# Patient Record
Sex: Female | Born: 1980 | Race: White | Hispanic: No | Marital: Married | Smoking: Never smoker
Health system: Southern US, Community
[De-identification: ages and names within clinical notes are randomized; demographics above are authoritative.]

## PROBLEM LIST (undated history)

## (undated) DIAGNOSIS — G43909 Migraine, unspecified, not intractable, without status migrainosus: Secondary | ICD-10-CM

## (undated) DIAGNOSIS — R51 Headache: Secondary | ICD-10-CM

## (undated) DIAGNOSIS — D649 Anemia, unspecified: Secondary | ICD-10-CM

## (undated) DIAGNOSIS — K209 Esophagitis, unspecified without bleeding: Secondary | ICD-10-CM

## (undated) DIAGNOSIS — K635 Polyp of colon: Secondary | ICD-10-CM

## (undated) DIAGNOSIS — R5382 Chronic fatigue, unspecified: Secondary | ICD-10-CM

## (undated) DIAGNOSIS — R519 Headache, unspecified: Secondary | ICD-10-CM

## (undated) DIAGNOSIS — B2700 Gammaherpesviral mononucleosis without complication: Secondary | ICD-10-CM

## (undated) DIAGNOSIS — M1990 Unspecified osteoarthritis, unspecified site: Secondary | ICD-10-CM

## (undated) DIAGNOSIS — S060X9A Concussion with loss of consciousness of unspecified duration, initial encounter: Secondary | ICD-10-CM

## (undated) DIAGNOSIS — R202 Paresthesia of skin: Secondary | ICD-10-CM

## (undated) DIAGNOSIS — F4310 Post-traumatic stress disorder, unspecified: Secondary | ICD-10-CM

## (undated) DIAGNOSIS — S060XAA Concussion with loss of consciousness status unknown, initial encounter: Secondary | ICD-10-CM

## (undated) DIAGNOSIS — K219 Gastro-esophageal reflux disease without esophagitis: Secondary | ICD-10-CM

## (undated) DIAGNOSIS — F4312 Post-traumatic stress disorder, chronic: Secondary | ICD-10-CM

## (undated) DIAGNOSIS — E041 Nontoxic single thyroid nodule: Secondary | ICD-10-CM

## (undated) DIAGNOSIS — M5020 Other cervical disc displacement, unspecified cervical region: Secondary | ICD-10-CM

## (undated) DIAGNOSIS — R011 Cardiac murmur, unspecified: Secondary | ICD-10-CM

## (undated) HISTORY — PX: DENTAL SURGERY: SHX609

## (undated) HISTORY — DX: Chronic fatigue, unspecified: R53.82

## (undated) HISTORY — DX: Post-traumatic stress disorder, chronic: F43.12

## (undated) HISTORY — DX: Polyp of colon: K63.5

## (undated) HISTORY — DX: Esophagitis, unspecified without bleeding: K20.90

## (undated) HISTORY — DX: Cardiac murmur, unspecified: R01.1

## (undated) HISTORY — DX: Post-traumatic stress disorder, unspecified: F43.10

## (undated) HISTORY — DX: Nontoxic single thyroid nodule: E04.1

## (undated) HISTORY — DX: Esophagitis, unspecified: K20.9

## (undated) HISTORY — PX: UPPER GI ENDOSCOPY: SHX6162

## (undated) HISTORY — PX: COLONOSCOPY: SHX174

## (undated) HISTORY — DX: Concussion with loss of consciousness status unknown, initial encounter: S06.0XAA

## (undated) HISTORY — DX: Concussion with loss of consciousness of unspecified duration, initial encounter: S06.0X9A

## (undated) HISTORY — DX: Migraine, unspecified, not intractable, without status migrainosus: G43.909

## (undated) HISTORY — DX: Other cervical disc displacement, unspecified cervical region: M50.20

## (undated) HISTORY — DX: Paresthesia of skin: R20.2

---

## 2016-09-23 ENCOUNTER — Encounter: Payer: Self-pay | Admitting: Family Medicine

## 2016-09-29 ENCOUNTER — Encounter: Payer: Self-pay | Admitting: Family Medicine

## 2017-04-03 ENCOUNTER — Emergency Department (HOSPITAL_COMMUNITY): Payer: BLUE CROSS/BLUE SHIELD

## 2017-04-03 ENCOUNTER — Emergency Department (HOSPITAL_COMMUNITY)
Admission: EM | Admit: 2017-04-03 | Discharge: 2017-04-03 | Disposition: A | Discharge status: Home / Self Care | Payer: BLUE CROSS/BLUE SHIELD | Attending: Emergency Medicine | Admitting: Emergency Medicine

## 2017-04-03 ENCOUNTER — Other Ambulatory Visit: Payer: Self-pay

## 2017-04-03 ENCOUNTER — Encounter (HOSPITAL_COMMUNITY): Payer: Self-pay | Admitting: Emergency Medicine

## 2017-04-03 DIAGNOSIS — R531 Weakness: Secondary | ICD-10-CM

## 2017-04-03 DIAGNOSIS — Z79899 Other long term (current) drug therapy: Secondary | ICD-10-CM | POA: Insufficient documentation

## 2017-04-03 DIAGNOSIS — E860 Dehydration: Secondary | ICD-10-CM | POA: Insufficient documentation

## 2017-04-03 DIAGNOSIS — R634 Abnormal weight loss: Secondary | ICD-10-CM | POA: Diagnosis not present

## 2017-04-03 HISTORY — DX: Gammaherpesviral mononucleosis without complication: B27.00

## 2017-04-03 LAB — COMPREHENSIVE METABOLIC PANEL
ALT: 17 U/L (ref 14–54)
AST: 19 U/L (ref 15–41)
Albumin: 4.1 g/dL (ref 3.5–5.0)
Alkaline Phosphatase: 63 U/L (ref 38–126)
Anion gap: 10 (ref 5–15)
BUN: 11 mg/dL (ref 6–20)
CHLORIDE: 100 mmol/L — AB (ref 101–111)
CO2: 26 mmol/L (ref 22–32)
CREATININE: 0.87 mg/dL (ref 0.44–1.00)
Calcium: 9.4 mg/dL (ref 8.9–10.3)
GFR calc Af Amer: >60 mL/min (ref >60)
Glucose, Bld: 104 mg/dL — ABNORMAL HIGH (ref 65–99)
Potassium: 4.1 mmol/L (ref 3.5–5.1)
Sodium: 136 mmol/L (ref 135–145)
Total Bilirubin: 0.4 mg/dL (ref 0.3–1.2)
Total Protein: 6.7 g/dL (ref 6.5–8.1)

## 2017-04-03 LAB — URINALYSIS, ROUTINE W REFLEX MICROSCOPIC
BILIRUBIN URINE: NEGATIVE
GLUCOSE, UA: NEGATIVE mg/dL
HGB URINE DIPSTICK: NEGATIVE
Ketones, ur: NEGATIVE mg/dL
Leukocytes, UA: NEGATIVE
Nitrite: NEGATIVE
Protein, ur: NEGATIVE mg/dL
SPECIFIC GRAVITY, URINE: 1.014 (ref 1.005–1.030)
pH: 7 (ref 5.0–8.0)

## 2017-04-03 LAB — CBC WITH DIFFERENTIAL/PLATELET
BASOS ABS: 0 10*3/uL (ref 0.0–0.1)
Basophils Relative: 0 %
EOS PCT: 0 %
Eosinophils Absolute: 0 10*3/uL (ref 0.0–0.7)
HCT: 41.8 % (ref 36.0–46.0)
Hemoglobin: 13.5 g/dL (ref 12.0–15.0)
LYMPHS PCT: 21 %
Lymphs Abs: 1.8 10*3/uL (ref 0.7–4.0)
MCH: 28.9 pg (ref 26.0–34.0)
MCHC: 32.3 g/dL (ref 30.0–36.0)
MCV: 89.5 fL (ref 78.0–100.0)
Monocytes Absolute: 0.4 10*3/uL (ref 0.1–1.0)
Monocytes Relative: 4 %
NEUTROS ABS: 6.5 10*3/uL (ref 1.7–7.7)
Neutrophils Relative %: 75 %
PLATELETS: 274 10*3/uL (ref 150–400)
RBC: 4.67 MIL/uL (ref 3.87–5.11)
RDW: 12.7 % (ref 11.5–15.5)
WBC: 8.6 10*3/uL (ref 4.0–10.5)

## 2017-04-03 LAB — I-STAT BETA HCG BLOOD, ED (MC, WL, AP ONLY): I-stat hCG, quantitative: <5 m[IU]/mL (ref <5)

## 2017-04-03 MED ORDER — SODIUM CHLORIDE 0.9 % IV BOLUS (SEPSIS)
1000.0000 mL | Freq: Once | INTRAVENOUS | Status: AC
  Administered 2017-04-03: 1000 mL via INTRAVENOUS

## 2017-04-03 NOTE — ED Provider Notes (Signed)
[facility] Provider Note   CSN: [id number] Arrival date & time: 04/03/17  1152     History   Chief Complaint Chief Complaint  Patient presents with  . Weight Loss    HPI [facility] is a 37 y.o. female.  Patient states that she has been losing a lot of weight.  Over the last couple years she has had difficulty with absorbing nutrition.  She was in a foreign country [place] a long time and has been cared for by a specialist in immune diseases in [place].  She feels like she has been losing more weight again.  No vomiting or diarrhea   The history is provided by the patient. No language interpreter was used.  Weakness  Primary symptoms include focal weakness. This is a recurrent problem. The current episode started more than 1 week ago. The problem has not changed since onset.There was no focality noted. There has been no fever. Pertinent negatives include no shortness of breath, no chest pain and no headaches. There were no medications administered prior to arrival. Associated medical issues include trauma.    Past Medical History:  Diagnosis Date  . Acute Epstein Barr virus (EBV) infection     There are no active problems to display for this patient.   Past Surgical History:  Procedure Laterality Date  . DENTAL SURGERY    . UPPER GI ENDOSCOPY      OB History    Gravida Para Term Preterm AB Living             4   SAB TAB Ectopic Multiple Live Births                   Home Medications    Prior to Admission medications   Medication Sig Start Date End Date Taking? Authorizing Provider  ACYCLOVIR PO Take 500 mg by mouth 5 (five) times daily.   Yes [provider]  Cholecalciferol (VITAMIN D3) LIQD Place 1 drop under the tongue daily.   Yes [provider]  Omega-3 Fatty Acids (FISH OIL PO) Take 1 capsule by mouth 3 (three) times daily.   Yes [provider]  OVER THE COUNTER MEDICATION Place 5 mLs under the tongue  daily. Glutothiame   Yes [provider]  OVER THE COUNTER MEDICATION Take 1 tablet by mouth 3 (three) times daily. Optiferin-C   Yes [provider]  OVER THE COUNTER MEDICATION Take 2 tablets by mouth 3 (three) times daily. Ultra Preventive   Yes [provider]    Family History History reviewed. No pertinent family history.  Social History Social History   Tobacco Use  . Smoking status: Never Smoker  . Smokeless tobacco: Never Used  Substance Use Topics  . Alcohol use: No    Frequency: Never  . Drug use: No     Allergies   Patient has no known allergies.   Review of Systems Review of Systems  Constitutional: Negative for appetite change and fatigue.  HENT: Negative for congestion, ear discharge and sinus pressure.   Eyes: Negative for discharge.  Respiratory: Negative for cough and shortness of breath.   Cardiovascular: Negative for chest pain.  Gastrointestinal: Negative for abdominal pain and diarrhea.  Genitourinary: Negative for frequency and hematuria.  Musculoskeletal: Negative for back pain.  Skin: Negative for rash.  Neurological: Positive for focal weakness and weakness. Negative for seizures and headaches.  Psychiatric/Behavioral: Negative for hallucinations.     Physical Exam Updated Vital  Signs BP 116/82   Pulse (!) 119   Temp 98.4 F (36.9 C) (Oral)   Resp 14   Ht 5\' 4"  (1.626 m)   Wt 50.2 kg (110 lb 11.2 oz)   LMP 03/20/2017   SpO2 98%   BMI 19.00 kg/m   Physical Exam  Constitutional: She is oriented to person, place, and time.  Patient is cachectic  HENT:  Head: Normocephalic.  Eyes: Conjunctivae and EOM are normal. No scleral icterus.  Neck: Neck supple. No thyromegaly present.  Cardiovascular: Normal rate and regular rhythm. Exam reveals no gallop and no friction rub.  No murmur heard. Pulmonary/Chest: No stridor. She has no wheezes. She has no rales. She exhibits no tenderness.  Abdominal: She exhibits  no distension. There is no tenderness. There is no rebound.  Musculoskeletal: Normal range of motion. She exhibits no edema.  Lymphadenopathy:    She has no cervical adenopathy.  Neurological: She is oriented to person, place, and time. She exhibits normal muscle tone. Coordination normal.  Skin: No rash noted. No erythema.  Psychiatric: She has a normal mood and affect. Her behavior is normal.     ED Treatments / Results  Labs (all labs ordered are listed, but only abnormal results are displayed) Labs Reviewed  COMPREHENSIVE METABOLIC PANEL - Abnormal; Notable for the following components:      Result Value   Chloride 100 (*)    Glucose, Bld 104 (*)    All other components within normal limits  URINALYSIS, ROUTINE W REFLEX MICROSCOPIC - Abnormal; Notable for the following components:   APPearance HAZY (*)    All other components within normal limits  CBC WITH DIFFERENTIAL/PLATELET  I-STAT BETA HCG BLOOD, ED ([facility], [facility], [facility] ONLY)    EKG  EKG Interpretation None       Radiology Dg Abd Acute W/chest  Result Date: 04/03/2017 CLINICAL DATA:  Weight loss EXAM: DG ABDOMEN ACUTE W/ 1V CHEST COMPARISON:  None. FINDINGS: There is no evidence of dilated bowel loops or free intraperitoneal air. No radiopaque calculi or other significant radiographic abnormality is seen. Heart size and mediastinal contours are within normal limits. Both lungs are clear. IMPRESSION: Negative abdominal radiographs.  No acute cardiopulmonary disease. Electronically Signed   By: Marlan Palau M.D.   On: 04/03/2017 17:22    Procedures Procedures (including critical care time)  Medications Ordered in ED Medications  sodium chloride 0.9 % bolus 1,000 mL (1,000 mLs Intravenous New Bag/Given 04/03/17 1547)     Initial Impression / Assessment and Plan / ED Course  I have reviewed the triage vital signs and the nursing notes.  Pertinent labs & imaging results that were available during my care of the patient  were reviewed by me and considered in my medical decision making (see chart for details). Labs including CBC chemistries liver panel were all unremarkable plain films of the abdomen unremarkable.  Urinalysis normal.  Patient was given 1 L of fluids because of mild dehydration.  She was told to eat high calorie foods since she has lost 10 pounds and drink Ensure between meals.  She is been referred to GI here      Final Clinical Impressions(s) / ED Diagnoses   Final diagnoses:  Weakness    ED Discharge Orders    None       Bethann Berkshire, MD 04/03/17 [phone]

## 2017-04-03 NOTE — Discharge Instructions (Signed)
Eat high calorie meals.  Drink Ensure between meals.  And follow-up with a gastroenterologist.  You have been referred to Dr.Rourke in [place]

## 2017-04-03 NOTE — ED Triage Notes (Signed)
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

## 2017-04-03 NOTE — ED Notes (Signed)
Pt would [facility] a [facility].  Phone number [phone]

## 2017-04-13 ENCOUNTER — Ambulatory Visit (INDEPENDENT_AMBULATORY_CARE_PROVIDER_SITE_OTHER): Payer: BLUE CROSS/BLUE SHIELD | Admitting: Family Medicine

## 2017-04-13 ENCOUNTER — Other Ambulatory Visit: Payer: Self-pay

## 2017-04-13 ENCOUNTER — Encounter: Payer: Self-pay | Admitting: Family Medicine

## 2017-04-13 VITALS — BP 114/80 | HR 88 | Temp 98.1°F | Resp 16 | Ht 64.0 in | Wt 112.0 lb

## 2017-04-13 DIAGNOSIS — E063 Autoimmune thyroiditis: Secondary | ICD-10-CM | POA: Diagnosis not present

## 2017-04-13 DIAGNOSIS — E559 Vitamin D deficiency, unspecified: Secondary | ICD-10-CM | POA: Diagnosis not present

## 2017-04-13 DIAGNOSIS — Z87828 Personal history of other (healed) physical injury and trauma: Secondary | ICD-10-CM | POA: Diagnosis not present

## 2017-04-13 DIAGNOSIS — H532 Diplopia: Secondary | ICD-10-CM

## 2017-04-13 DIAGNOSIS — R634 Abnormal weight loss: Secondary | ICD-10-CM | POA: Diagnosis not present

## 2017-04-13 DIAGNOSIS — R760 Raised antibody titer: Secondary | ICD-10-CM | POA: Diagnosis not present

## 2017-04-13 DIAGNOSIS — R5383 Other fatigue: Secondary | ICD-10-CM | POA: Diagnosis not present

## 2017-04-13 DIAGNOSIS — K209 Esophagitis, unspecified without bleeding: Secondary | ICD-10-CM

## 2017-04-13 DIAGNOSIS — M62838 Other muscle spasm: Secondary | ICD-10-CM

## 2017-04-13 DIAGNOSIS — Z91018 Allergy to other foods: Secondary | ICD-10-CM

## 2017-04-13 DIAGNOSIS — H539 Unspecified visual disturbance: Secondary | ICD-10-CM

## 2017-04-13 MED ORDER — VITAMIN D (ERGOCALCIFEROL) 1.25 MG (50000 UNIT) PO CAPS: 50000.0000 [IU] | ORAL_CAPSULE | ORAL | 0 refills | Status: DC
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
## 2017-04-14 DIAGNOSIS — M5022 Other cervical disc displacement, mid-cervical region, unspecified level: Secondary | ICD-10-CM | POA: Diagnosis not present

## 2017-04-14 DIAGNOSIS — M47816 Spondylosis without myelopathy or radiculopathy, lumbar region: Secondary | ICD-10-CM | POA: Diagnosis not present

## 2017-04-14 DIAGNOSIS — M47812 Spondylosis without myelopathy or radiculopathy, cervical region: Secondary | ICD-10-CM | POA: Diagnosis not present

## 2017-04-14 DIAGNOSIS — M546 Pain in thoracic spine: Secondary | ICD-10-CM | POA: Diagnosis not present

## 2017-04-14 LAB — HEPATITIS PANEL, ACUTE
Hep A IgM: NONREACTIVE
Hep B C IgM: NONREACTIVE
Hepatitis B Surface Ag: NONREACTIVE
Hepatitis C Ab: NONREACTIVE
SIGNAL TO CUT-OFF: 0.01 (ref <1.00)

## 2017-04-14 LAB — MAGNESIUM: MAGNESIUM: 2.1 mg/dL (ref 1.5–2.5)

## 2017-04-14 LAB — CK: Total CK: 89 U/L (ref 29–143)

## 2017-04-14 LAB — SEDIMENTATION RATE: SED RATE: 2 mm/h (ref 0–20)

## 2017-04-14 LAB — VITAMIN B12: VITAMIN B 12: 951 pg/mL (ref 200–1100)

## 2017-04-14 LAB — TSH: TSH: 0.95 m[IU]/L

## 2017-04-15 DIAGNOSIS — R5383 Other fatigue: Secondary | ICD-10-CM | POA: Insufficient documentation

## 2017-04-15 DIAGNOSIS — M62838 Other muscle spasm: Secondary | ICD-10-CM | POA: Insufficient documentation

## 2017-04-15 DIAGNOSIS — H539 Unspecified visual disturbance: Secondary | ICD-10-CM | POA: Insufficient documentation

## 2017-04-15 DIAGNOSIS — R634 Abnormal weight loss: Secondary | ICD-10-CM | POA: Insufficient documentation

## 2017-04-15 DIAGNOSIS — K209 Esophagitis, unspecified without bleeding: Secondary | ICD-10-CM | POA: Insufficient documentation

## 2017-04-15 DIAGNOSIS — Z87828 Personal history of other (healed) physical injury and trauma: Secondary | ICD-10-CM | POA: Insufficient documentation

## 2017-04-15 DIAGNOSIS — R760 Raised antibody titer: Secondary | ICD-10-CM | POA: Insufficient documentation

## 2017-04-15 DIAGNOSIS — Z91018 Allergy to other foods: Secondary | ICD-10-CM | POA: Insufficient documentation

## 2017-04-15 DIAGNOSIS — E063 Autoimmune thyroiditis: Secondary | ICD-10-CM | POA: Insufficient documentation

## 2017-04-15 DIAGNOSIS — E559 Vitamin D deficiency, unspecified: Secondary | ICD-10-CM | POA: Insufficient documentation

## 2017-04-18 ENCOUNTER — Telehealth: Payer: Self-pay | Admitting: Family Medicine

## 2017-04-18 NOTE — Telephone Encounter (Signed)
Please call and advise patient that her labs were all within normal limits.  I have reviewed all of the records that she brought into the day of the visit.  After review of the testing due to her elevated ANA levels I am in agreement that she should be seen and evaluated by rheumatology.  I have placed that referral.  In addition, due to the recent endoscopy revealing esophagitis and her food allergies and intolerance I would like for her to be seen by Dr. Dionicia Ableraman.  I have placed that referral.  We do need to figure out if any biopsies were done at the time of the endoscopy and get the results of those.  Please ask her to get those results.  Due to her neurologic symptoms we will go ahead and proceed with the MRI of her head and after that test consider neurology.  I would like for her to see the allergist regarding all of her food allergies and intolerances.  I believe that she needs some actual test results in English that we can review and know are valid prior to placing her under such a restrictive diet with her degree of weight loss.  Please advise her that her hepatitis ABC was negative.  Her magnesium, B12, CK, and electrolytes were within normal limits. Please ask her to keep her scheduled follow-up and we will discuss more detail at that time.

## 2017-04-20 DIAGNOSIS — M47816 Spondylosis without myelopathy or radiculopathy, lumbar region: Secondary | ICD-10-CM | POA: Diagnosis not present

## 2017-04-20 DIAGNOSIS — M5022 Other cervical disc displacement, mid-cervical region, unspecified level: Secondary | ICD-10-CM | POA: Diagnosis not present

## 2017-04-20 DIAGNOSIS — M47812 Spondylosis without myelopathy or radiculopathy, cervical region: Secondary | ICD-10-CM | POA: Diagnosis not present

## 2017-04-20 DIAGNOSIS — M546 Pain in thoracic spine: Secondary | ICD-10-CM | POA: Diagnosis not present

## 2017-04-21 ENCOUNTER — Ambulatory Visit: Payer: BLUE CROSS/BLUE SHIELD | Admitting: Family Medicine

## 2017-04-21 NOTE — Telephone Encounter (Signed)
Called patient regarding message below. No answer, left generic message for patient to return call.   

## 2017-04-24 ENCOUNTER — Ambulatory Visit (HOSPITAL_COMMUNITY): Payer: BLUE CROSS/BLUE SHIELD

## 2017-04-24 ENCOUNTER — Other Ambulatory Visit (HOSPITAL_COMMUNITY): Payer: BLUE CROSS/BLUE SHIELD

## 2017-04-24 ENCOUNTER — Ambulatory Visit (HOSPITAL_COMMUNITY)
Admission: RE | Admit: 2017-04-24 | Discharge: 2017-04-24 | Disposition: A | Discharge status: Home / Self Care | Payer: BLUE CROSS/BLUE SHIELD | Source: Ambulatory Visit | Attending: Family Medicine | Admitting: Family Medicine

## 2017-04-24 DIAGNOSIS — H532 Diplopia: Secondary | ICD-10-CM | POA: Insufficient documentation

## 2017-04-24 DIAGNOSIS — R200 Anesthesia of skin: Secondary | ICD-10-CM | POA: Diagnosis not present

## 2017-04-24 MED ORDER — GADOBENATE DIMEGLUMINE 529 MG/ML IV SOLN
10.0000 mL | Freq: Once | INTRAVENOUS | Status: AC | PRN
  Administered 2017-04-24: 10 mL via INTRAVENOUS

## 2017-04-24 NOTE — Telephone Encounter (Signed)
Told to call [employer] services regarding peer to peer to get the MRI approved. Called and discussed with [employer]. Number valid from 04/21/2017 through 05/20/2017.Number [id number]. This is the pre-authorization number for the MRI of the brain. Facility will need this number to authorize.

## 2017-04-24 NOTE — Telephone Encounter (Signed)
Called rad dept and they are going to let [facility] come in now.  I just called [facility] and she is on the way to [facility] not to get MRI.  -  Called back and gave [facility] Rad Auth #.

## 2017-04-25 ENCOUNTER — Ambulatory Visit (HOSPITAL_COMMUNITY): Payer: BLUE CROSS/BLUE SHIELD

## 2017-04-25 ENCOUNTER — Ambulatory Visit: Payer: BLUE CROSS/BLUE SHIELD | Admitting: Diagnostic Neuroimaging

## 2017-04-25 ENCOUNTER — Encounter: Payer: Self-pay | Admitting: Diagnostic Neuroimaging

## 2017-04-25 VITALS — BP 122/85 | HR 88 | Ht 64.0 in | Wt 113.4 lb

## 2017-04-25 DIAGNOSIS — R413 Other amnesia: Secondary | ICD-10-CM

## 2017-04-25 DIAGNOSIS — M62838 Other muscle spasm: Secondary | ICD-10-CM | POA: Diagnosis not present

## 2017-04-25 NOTE — Progress Notes (Signed)
[facility]  PATIENT: Tina Haney DOB: December 01, 1980  REFERRING CLINICIAN: Milas Gain, MD HISTORY FROM: patient and chart review  REASON FOR VISIT: new consult    HISTORICAL  CHIEF COMPLAINT:  Chief Complaint  Patient presents with  . NP  Hagler    Has lived in [place] for 9 yrs, 45yrs started with GI, mental issues.  These issues still ongoing.  Now living here since January 2019.  Past yr.  muscle twitching back of arms, legs eye twtiching, muscle cramps. +ANA, +EBV  Has some autoimmune disease?  MS?   Brain concussion 1 1/2 year ago , +amnesia, recovered has some brain fog.   Marland Kitchen Hx head Injury,Muscle twitching, cramps.    HISTORY OF PRESENT ILLNESS:   37 year old female here for evaluation of muscle twitching, muscle spasm, fatigue, weakness, headaches, brain fog, eye twitching, blurred vision, since 2012.  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  By 2012 patient had lost weight, had GI symptoms with abdominal pain, and was evaluated by specialist.  She was diagnosed with vitamin deficiencies and iron deficiencies.  These were treated.  Continue to progressively worsen.  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  She was recommended to treat these conservatively.  Patient has had a vegan diet since age 65 up to 37 years old.  She has taken B12 supplements in the past but was found to be low in 2012.  Recent B12 levels have been normal.  For past 2 years patient has been eating some meat products in her diet.  Patient had MRI of the brain yesterday which we reviewed together.  Patient has had positive ANA in the past and seen rheumatologist in [place].  She is also planning to see a rheumatologist in [place] soon.  Patient has had significant depression, anxiety,  PTSD, chronic stress syndrome diagnoses in the past.  She feels like these are improved compared to previously.    REVIEW OF SYSTEMS: Full 14 system review of systems performed and negative with exception of: Weight loss fatigue ringing in ears itching blurred vision eye pain easy bruising lymph node enlargement memory loss headache numbness weakness dizziness passing out tremor sleepiness restless leg depression anxiety too much sleep decreased energy cramps aching muscle joint pain constipation.   ALLERGIES: No Known Allergies  HOME MEDICATIONS: Outpatient Medications Prior to Visit  Medication Sig Dispense Refill  . acyclovir (ZOVIRAX) 400 MG tablet 400 mg 3 (three) times daily.   3  . Cholecalciferol (VITAMIN D3) LIQD Place 1 drop under the tongue daily.    Marland Kitchen LYSINE PO Take 3 each by mouth.    . Omega-3 Fatty Acids (FISH OIL PO) Take 1 capsule by mouth 3 (three) times daily.    Marland Kitchen OVER THE COUNTER MEDICATION Place 5 mLs under the tongue daily. Glutothiame    . OVER THE COUNTER MEDICATION Take 2 tablets by mouth 3 (three) times daily. Ultra Preventive    . OVER THE COUNTER MEDICATION     . OVER THE COUNTER MEDICATION     . OVER THE COUNTER MEDICATION     . OVER THE COUNTER MEDICATION Take 1 tablet by mouth 3 (three) times daily. Optiferin-C     No facility-administered medications prior to visit.     PAST MEDICAL HISTORY: Past Medical History:  Diagnosis Date  .  Acute Epstein Barr virus (EBV) infection   . Chronic posttraumatic stress syndrome   . Concussion    09/2015,  Was 2nd or 3rd concussion.   . Esophagitis   . Herniated disc, cervical    C5/6 and 6/7   . Murmur, heart   . Polyp of colon   . PTSD (post-traumatic stress disorder)   . Thyroid nodule     PAST SURGICAL HISTORY: Past Surgical History:  Procedure Laterality Date  . COLONOSCOPY     Done in [place], 2 polyps, benign  . DENTAL SURGERY    . UPPER GI ENDOSCOPY     recent EGD done in [place] by Dr.  Uvaldo Rising    FAMILY HISTORY: Family History  Problem Relation Age of Onset  . Bipolar disorder Mother   . Celiac disease Daughter   . Cancer Maternal Grandmother   . Alzheimer's disease Maternal Grandmother   . Kidney disease Maternal Grandfather     SOCIAL HISTORY:  Social History   Socioeconomic History  . Marital status: Married    Spouse name: Not on file  . Number of children: Not on file  . Years of education: Not on file  . Highest education level: Not on file  Occupational History  . Not on file  Social Needs  . Financial resource strain: Not on file  . Food insecurity:    Worry: Not on file    Inability: Not on file  . Transportation needs:    Medical: Not on file    Non-medical: Not on file  Tobacco Use  . Smoking status: Never Smoker  . Smokeless tobacco: Never Used  Substance and Sexual Activity  . Alcohol use: No    Frequency: Never  . Drug use: No  . Sexual activity: Yes    Partners: Male    Birth control/protection: Surgical    Comment: husband vastectomy  Lifestyle  . Physical activity:    Days per week: Not on file    Minutes per session: Not on file  . Stress: Not on file  Relationships  . Social connections:    Talks on phone: Not on file    Gets together: Not on file    Attends religious service: Not on file    Active member of club or organization: Not on file    Attends meetings of clubs or organizations: Not on file    Relationship status: Not on file  . Intimate partner violence:    Fear of current or ex partner: Not on file    Emotionally abused: Not on file    Physically abused: Not on file    Forced sexual activity: Not on file  Other Topics Concern  . Not on file  Social History Narrative   Born in [place]. Grew up in [place] and [place]. Married with four children. Married for 15 years-12, 11, 10, 7. Attend school at [employer].   Education: BS some masters work.  Work: just had to resign.  Caffiene: one cup of  tea / day.    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   [employer] in [place], [place].    Came back to the [place] in 07/2016.    Seen by Functional medicine doctor and had lots of labs done and put on lots of supplement.      PHYSICAL EXAM  GENERAL EXAM/CONSTITUTIONAL: Vitals:  Vitals:   04/25/17 0852  BP: 122/85  Pulse: 88  Weight: 113  lb 6.4 oz (51.4 kg)  Height: 5\' 4"  (1.626 m)   Wt Readings from Last 3 Encounters:  04/25/17 113 lb 6.4 oz (51.4 kg)  04/13/17 112 lb (50.8 kg)  04/03/17 110 lb 11.2 oz (50.2 kg)       Body mass index is 19.47 kg/m.  Visual Acuity Screening   Right eye Left eye Both eyes  Without correction:     With correction: 20/30 20/20      Patient is in no distress; well developed, nourished and groomed; neck is supple  CARDIOVASCULAR:  Examination of carotid arteries is normal; no carotid bruits  Regular rate and rhythm, no murmurs  Examination of peripheral vascular system by observation and palpation is normal  EYES:  Ophthalmoscopic exam of optic discs and posterior segments is normal; no papilledema or hemorrhages  MUSCULOSKELETAL:  Gait, strength, tone, movements noted in Neurologic exam below  NEUROLOGIC: MENTAL STATUS:  No flowsheet data found.  awake, alert, oriented to person, place and time  recent and remote memory intact  normal attention and concentration  language fluent, comprehension intact, naming intact,   fund of knowledge appropriate  CRANIAL NERVE:   2nd - no papilledema on fundoscopic exam  2nd, 3rd, 4th, 6th - pupils equal and reactive to light, visual fields full to confrontation, extraocular muscles intact, no nystagmus  5th - facial sensation symmetric  7th - facial strength symmetric  8th - hearing intact  9th - palate elevates symmetrically, uvula midline  11th - shoulder shrug symmetric  12th - tongue protrusion midline  MOTOR:   normal bulk and tone,  full strength in the BUE, BLE  SENSORY:  normal and symmetric to light touch, temperature, vibration; PATCHY DECR PP IN FINGERS AND FEET COORDINATION:   finger-nose-finger, fine finger movements normal  REFLEXES:   deep tendon reflexes present and symmetric  GAIT/STATION:   narrow based gait; able to walk on toes, heels and tandem; romberg is negative    DIAGNOSTIC DATA (LABS, IMAGING, TESTING) - I reviewed patient records, labs, notes, testing and imaging myself where available.  Lab Results  Component Value Date   WBC 8.6 04/03/2017   HGB 13.5 04/03/2017   HCT 41.8 04/03/2017   MCV 89.5 04/03/2017   PLT 274 04/03/2017      Component Value Date/Time   NA 136 04/03/2017 1312   K 4.1 04/03/2017 1312   CL 100 (L) 04/03/2017 1312   CO2 26 04/03/2017 1312   GLUCOSE 104 (H) 04/03/2017 1312   BUN 11 04/03/2017 1312   CREATININE 0.87 04/03/2017 1312   CALCIUM 9.4 04/03/2017 1312   PROT 6.7 04/03/2017 1312   ALBUMIN 4.1 04/03/2017 1312   AST 19 04/03/2017 1312   ALT 17 04/03/2017 1312   ALKPHOS 63 04/03/2017 1312   BILITOT 0.4 04/03/2017 1312   GFRNONAA >60 04/03/2017 1312   GFRAA >60 04/03/2017 1312   No results found for: CHOL, HDL, LDLCALC, LDLDIRECT, TRIG, CHOLHDL No results found for: JYNW2N Lab Results  Component Value Date   VITAMINB12 951 04/13/2017   Lab Results  Component Value Date   TSH 0.95 04/13/2017   Lab Results  Component Value Date   CKTOTAL 89 04/13/2017    04/27/16 EMG/NCS (report from [place]) - 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 -VRP]  - Normal examination.  No abnormality seen to explain headache or the other presenting symptoms.    ASSESSMENT AND PLAN  37 y.o. year old female here with constellation of symptoms including numbness,  weakness, muscle spasm, muscle twitching, fatigue, mental processing difficulty, brain fog, history of concussions, history of positive ANA, history of PTSD.  Neurologic examination, MRI brain are unremarkable.  Patient may have some evidence for a left C6-7 radiculopathy although no evidence clinically today.  No specific neurologic diagnosis found.   Dx:  1. Muscle spasm   2. Memory loss      PLAN:  - I reassured patient that I find no evidence of primary neurologic disease - continue to optimized nutrition, physical activity and stress mgmt techniques  Return pending symptom progression or change. may return as needed    Suanne MarkerVIKRAM R. PENUMALLI, MD 04/25/2017, 9:29 AM Certified in Neurology, Neurophysiology and Neuroimaging  [facility] [address], [address] [place], [place] [address] [phone]

## 2017-04-26 DIAGNOSIS — M5030 Other cervical disc degeneration, unspecified cervical region: Secondary | ICD-10-CM | POA: Diagnosis not present

## 2017-04-26 DIAGNOSIS — G894 Chronic pain syndrome: Secondary | ICD-10-CM | POA: Diagnosis not present

## 2017-04-27 ENCOUNTER — Ambulatory Visit (INDEPENDENT_AMBULATORY_CARE_PROVIDER_SITE_OTHER): Payer: BLUE CROSS/BLUE SHIELD | Admitting: Family Medicine

## 2017-04-27 ENCOUNTER — Encounter: Payer: Self-pay | Admitting: Family Medicine

## 2017-04-27 VITALS — BP 114/78 | HR 77 | Temp 98.0°F | Resp 16 | Ht 64.0 in | Wt 113.5 lb

## 2017-04-27 DIAGNOSIS — R768 Other specified abnormal immunological findings in serum: Secondary | ICD-10-CM

## 2017-04-27 DIAGNOSIS — F419 Anxiety disorder, unspecified: Secondary | ICD-10-CM | POA: Diagnosis not present

## 2017-04-27 DIAGNOSIS — R5383 Other fatigue: Secondary | ICD-10-CM | POA: Diagnosis not present

## 2017-04-27 NOTE — Progress Notes (Signed)
Patient ID: Tina Haney, female    DOB: March 05, 1980, 37 y.o.   MRN: [id number]  Chief Complaint  Patient presents with  . Follow-up    Allergies Patient has no known allergies.  Subjective:   Tina Haney is a 37 y.o. female who presents to [facility] today.  HPI Tina Haney presents today for a brief follow-up visit after being seen by the neurologist  and having had the MRI completed.  She reports that she was told that her MRI was within normal limits and there was no evidence of any neurologic disease that was affecting her brain.  She reports that she is not sure that she necessarily agrees with this because she reports she is still having symptoms associated with feeling tired and she is concerned that the rheumatologic disease that she could have could be affecting her neurologically.  She was also seen by Dr. Ethelene Hal in orthopedics.  She reports that he is possibly going to do injections after getting the records from previous MD. she also reports that she is still not sure but she may end up needing surgery on her spine secondary to her disc disease.  She reports that she has not seen or heard anything about the rheumatologist visit.  She reports that she is concerned about all the symptoms that she is having.  She reports that she is concerned she could have Sjogren's syndrome.  She believes that she may need a lip biopsy performed to help diagnose this.  She reports that she has an upcoming allergy appointment but she is not sure if she should go.  She reports that her allergy testing in the past did show some food allergies that she is cut out the foods in her diet more because she believes that she needs to for an anti-inflammatory diet to help with her rheumatologic issues.  She reports that she avoids gluten, dairy, and certain other foods because of bloating.  She does have an upcoming appointment with gastroenterology.  She reports that she is not interested in  seeing a psychiatrist or therapist at this time.  She does believe that she has depression but does not feel that she clinically needs to be on medication at this time.  She reports that her energy level is still low, she does not always sleep well.  She can feel anxious.  She reports that she does not feel clinically depressed.  She does have a past history of PTSD, anxiety, and history of depression.  She feels like she should not go on medications to control her symptoms because she believes that then everyone tries to believe her symptoms are not related to a rheumatologic disorder but due to an anxiety disorder.   Past Medical History:  Diagnosis Date  . Acute Epstein Barr virus (EBV) infection   . Chronic posttraumatic stress syndrome   . Concussion    09/2015,  Was 2nd or 3rd concussion.   . Esophagitis   . Herniated disc, cervical    C5/6 and 6/7   . Murmur, heart   . Polyp of colon   . PTSD (post-traumatic stress disorder)   . Thyroid nodule     Past Surgical History:  Procedure Laterality Date  . COLONOSCOPY     Done in [place], 2 polyps, benign  . DENTAL SURGERY    . UPPER GI ENDOSCOPY     recent EGD done in [place] by Dr. Uvaldo Rising    Family History  Problem Relation Age  of Onset  . Bipolar disorder Mother   . Celiac disease Daughter   . Cancer Maternal Grandmother   . Alzheimer's disease Maternal Grandmother   . Kidney disease Maternal Grandfather      Social History   Socioeconomic History  . Marital status: Married    Spouse name: Not on file  . Number of children: Not on file  . Years of education: Not on file  . Highest education level: Not on file  Occupational History  . Not on file  Social Needs  . Financial resource strain: Not on file  . Food insecurity:    Worry: Not on file    Inability: Not on file  . Transportation needs:    Medical: Not on file    Non-medical: Not on file  Tobacco Use  . Smoking status: Never Smoker  . Smokeless tobacco:  Never Used  Substance and Sexual Activity  . Alcohol use: No    Frequency: Never  . Drug use: No  . Sexual activity: Yes    Partners: Male    Birth control/protection: Surgical    Comment: husband vastectomy  Lifestyle  . Physical activity:    Days per week: Not on file    Minutes per session: Not on file  . Stress: Not on file  Relationships  . Social connections:    Talks on phone: Not on file    Gets together: Not on file    Attends religious service: Not on file    Active member of club or organization: Not on file    Attends meetings of clubs or organizations: Not on file    Relationship status: Not on file  Other Topics Concern  . Not on file  Social History Narrative   Born in [place] Married with four children. Married for 15 years-12, 11, 10, 7. Attend school at [employer].   Education: BS some masters work.  Work: just had to resign.  Caffiene: one cup of tea / day.    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   [employer], [place].    [place] in [place].    Seen by Functional medicine doctor and had lots of labs done and put on lots of supplement.     Review of Systems  Constitutional: Positive for fatigue.  Eyes:       Patient reports dry eyes and inability to wear contacts due to dry eyes.  Gastrointestinal: Positive for abdominal distention. Negative for abdominal pain and nausea.  Musculoskeletal: Positive for arthralgias and neck pain. Negative for joint swelling.  Skin: Negative for rash.  Psychiatric/Behavioral: Negative for agitation, behavioral problems, dysphoric mood and suicidal ideas. The patient is nervous/anxious.      Objective:   BP 114/78 (BP Location: Left Arm, Patient Position: Sitting, Cuff Size: Normal)   Pulse 77   Temp 98 F (36.7 C) (Temporal)   Resp 16   Ht 5\' 4"  (1.626 m)   Wt 113 lb 8 oz (51.5 kg)   LMP 04/18/2017   SpO2 97%    BMI 19.48 kg/m   Physical Exam Alert and oriented in no acute distress.  Heart regular rate and rhythm.  Lungs clear to auscultation bilaterally.  Skin without obvious rashes.  Lips appear dry.  Patient is wearing glasses today in office.  Anxious mood and affect.  Slightly labile.  Tearful during discussion in office  today.  Assessment and Plan  1. Fatigue, unspecified type 2. Positive ANA (antinuclear antibody) 3. Anxiety  Discussed with patient regarding her upcoming appointments.  She is somewhat hesitant to go to the allergist.  I have reviewed her allergy studies and testing that was performed.  I do recommend with her symptoms that she keep scheduled follow-up with allergy and gastroenterology.  I did tell her that we need to get the records from the physician to perform the endoscopy in [place], including the biopsy results.  She is to keep scheduled appointment with orthopedics.  Today we did call and get patient an appointment with rheumatologist.  She defers psychiatry at this time.  She will consider therapy.  I did discuss with patient in detail that I do believe that she has true symptoms of fatigue and joint pain/inflammation with a possible rheumatologic condition.  However, I also believe that she has suffered from tremendous chronic stress secondary to her life [place] and still is dealing with trauma and subsequent anxiety and possible depression.  She is not interested in taking any medication at this time.  I did tell her if she changes her mind at any point to please let me know.  She will consider seeing a therapist.  She will follow-up in our office in 3 months or after she completes these follow-up visits.  If she does develop any questions or concerns throughout this process she will let us know. Return in about 3 months (around 07/28/2017). Aliene Beams, MD 04/27/2017

## 2017-04-28 DIAGNOSIS — M5022 Other cervical disc displacement, mid-cervical region, unspecified level: Secondary | ICD-10-CM | POA: Diagnosis not present

## 2017-04-28 DIAGNOSIS — M47816 Spondylosis without myelopathy or radiculopathy, lumbar region: Secondary | ICD-10-CM | POA: Diagnosis not present

## 2017-04-28 DIAGNOSIS — M546 Pain in thoracic spine: Secondary | ICD-10-CM | POA: Diagnosis not present

## 2017-04-28 DIAGNOSIS — M47812 Spondylosis without myelopathy or radiculopathy, cervical region: Secondary | ICD-10-CM | POA: Diagnosis not present
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
## 2017-05-01 ENCOUNTER — Ambulatory Visit: Payer: BLUE CROSS/BLUE SHIELD | Admitting: Rheumatology

## 2017-05-01 ENCOUNTER — Ambulatory Visit (INDEPENDENT_AMBULATORY_CARE_PROVIDER_SITE_OTHER): Payer: Self-pay

## 2017-05-01 ENCOUNTER — Encounter: Payer: Self-pay | Admitting: Rheumatology

## 2017-05-01 ENCOUNTER — Telehealth: Payer: Self-pay | Admitting: Rheumatology

## 2017-05-01 VITALS — BP 125/81 | HR 90 | Resp 15 | Ht 64.0 in | Wt 117.0 lb

## 2017-05-01 DIAGNOSIS — R768 Other specified abnormal immunological findings in serum: Secondary | ICD-10-CM | POA: Diagnosis not present

## 2017-05-01 DIAGNOSIS — M79672 Pain in left foot: Secondary | ICD-10-CM | POA: Diagnosis not present

## 2017-05-01 DIAGNOSIS — M79671 Pain in right foot: Secondary | ICD-10-CM

## 2017-05-01 DIAGNOSIS — M5136 Other intervertebral disc degeneration, lumbar region: Secondary | ICD-10-CM | POA: Diagnosis not present

## 2017-05-01 DIAGNOSIS — M5030 Other cervical disc degeneration, unspecified cervical region: Secondary | ICD-10-CM | POA: Diagnosis not present

## 2017-05-01 DIAGNOSIS — R5383 Other fatigue: Secondary | ICD-10-CM

## 2017-05-01 DIAGNOSIS — E063 Autoimmune thyroiditis: Secondary | ICD-10-CM

## 2017-05-01 DIAGNOSIS — E559 Vitamin D deficiency, unspecified: Secondary | ICD-10-CM

## 2017-05-01 DIAGNOSIS — R634 Abnormal weight loss: Secondary | ICD-10-CM | POA: Diagnosis not present

## 2017-05-01 NOTE — Telephone Encounter (Signed)
Attempted to contact the patient and left message for patient to call the office.  

## 2017-05-01 NOTE — Telephone Encounter (Signed)
Patient left a voicemail stating that she was seen in our office this morning and was told to go to "[facility]" to get her blood work done.  Patient states when she got there she was told that they do not file insurance and wanted her to sign a form stating she would be responsible for any non covered amount.  Patient does not want to sign if she doesn't know how much it will be.  Patient called her insurance company but they don't know the codes of the tests that are ordered.  Patient is very confused and needs a return call.

## 2017-05-01 NOTE — Telephone Encounter (Signed)
Patient advised that the [facility] rep is speaking with the [facility] and advising them on how the [facility] labs are to be collected.Patient is worried about signing any paperwork that would  Leave her with a large bill. Patient advised that the labs and the filing of insurance is done by the [facility] lab company.

## 2017-05-03 DIAGNOSIS — M5022 Other cervical disc displacement, mid-cervical region, unspecified level: Secondary | ICD-10-CM | POA: Diagnosis not present

## 2017-05-03 DIAGNOSIS — M546 Pain in thoracic spine: Secondary | ICD-10-CM | POA: Diagnosis not present

## 2017-05-03 DIAGNOSIS — M47812 Spondylosis without myelopathy or radiculopathy, cervical region: Secondary | ICD-10-CM | POA: Diagnosis not present

## 2017-05-03 DIAGNOSIS — M47816 Spondylosis without myelopathy or radiculopathy, lumbar region: Secondary | ICD-10-CM | POA: Diagnosis not present

## 2017-05-08 DIAGNOSIS — F4310 Post-traumatic stress disorder, unspecified: Secondary | ICD-10-CM | POA: Diagnosis not present

## 2017-05-08 DIAGNOSIS — F411 Generalized anxiety disorder: Secondary | ICD-10-CM | POA: Diagnosis not present

## 2017-05-09 DIAGNOSIS — M546 Pain in thoracic spine: Secondary | ICD-10-CM | POA: Diagnosis not present

## 2017-05-09 DIAGNOSIS — M47812 Spondylosis without myelopathy or radiculopathy, cervical region: Secondary | ICD-10-CM | POA: Diagnosis not present

## 2017-05-09 DIAGNOSIS — M47816 Spondylosis without myelopathy or radiculopathy, lumbar region: Secondary | ICD-10-CM | POA: Diagnosis not present

## 2017-05-09 DIAGNOSIS — M5022 Other cervical disc displacement, mid-cervical region, unspecified level: Secondary | ICD-10-CM | POA: Diagnosis not present

## 2017-05-12 ENCOUNTER — Telehealth: Payer: Self-pay | Admitting: Rheumatology

## 2017-05-12 NOTE — Telephone Encounter (Signed)
I notified patient that her  lab results from [facility] were negative. Pollyann SavoyShaili Nicolas Banh, MD

## 2017-05-15 DIAGNOSIS — F4323 Adjustment disorder with mixed anxiety and depressed mood: Secondary | ICD-10-CM | POA: Diagnosis not present

## 2017-05-16 DIAGNOSIS — F4310 Post-traumatic stress disorder, unspecified: Secondary | ICD-10-CM | POA: Diagnosis not present

## 2017-05-16 DIAGNOSIS — F411 Generalized anxiety disorder: Secondary | ICD-10-CM | POA: Diagnosis not present

## 2017-05-17 DIAGNOSIS — M546 Pain in thoracic spine: Secondary | ICD-10-CM | POA: Diagnosis not present

## 2017-05-17 DIAGNOSIS — M5022 Other cervical disc displacement, mid-cervical region, unspecified level: Secondary | ICD-10-CM | POA: Diagnosis not present

## 2017-05-17 DIAGNOSIS — M47812 Spondylosis without myelopathy or radiculopathy, cervical region: Secondary | ICD-10-CM | POA: Diagnosis not present

## 2017-05-17 DIAGNOSIS — M47816 Spondylosis without myelopathy or radiculopathy, lumbar region: Secondary | ICD-10-CM | POA: Diagnosis not present

## 2017-05-22 ENCOUNTER — Ambulatory Visit: Payer: BLUE CROSS/BLUE SHIELD | Admitting: Allergy & Immunology

## 2017-05-22 ENCOUNTER — Encounter: Payer: Self-pay | Admitting: Allergy & Immunology

## 2017-05-22 VITALS — BP 128/80 | HR 76 | Resp 16 | Ht 63.5 in | Wt 116.8 lb

## 2017-05-22 DIAGNOSIS — D508 Other iron deficiency anemias: Secondary | ICD-10-CM | POA: Diagnosis not present

## 2017-05-22 DIAGNOSIS — T781XXD Other adverse food reactions, not elsewhere classified, subsequent encounter: Secondary | ICD-10-CM

## 2017-05-22 DIAGNOSIS — E042 Nontoxic multinodular goiter: Secondary | ICD-10-CM | POA: Diagnosis not present

## 2017-05-22 DIAGNOSIS — R5382 Chronic fatigue, unspecified: Secondary | ICD-10-CM | POA: Diagnosis not present

## 2017-05-22 DIAGNOSIS — R140 Abdominal distension (gaseous): Secondary | ICD-10-CM
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
## 2017-05-23 DIAGNOSIS — F4323 Adjustment disorder with mixed anxiety and depressed mood: Secondary | ICD-10-CM | POA: Diagnosis not present

## 2017-05-24 ENCOUNTER — Encounter: Payer: Self-pay | Admitting: Allergy & Immunology

## 2017-05-24 DIAGNOSIS — D508 Other iron deficiency anemias: Secondary | ICD-10-CM | POA: Insufficient documentation

## 2017-05-24 DIAGNOSIS — R140 Abdominal distension (gaseous): Secondary | ICD-10-CM | POA: Insufficient documentation

## 2017-05-24 DIAGNOSIS — T781XXA Other adverse food reactions, not elsewhere classified, initial encounter: Secondary | ICD-10-CM | POA: Insufficient documentation
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
## 2017-05-25 ENCOUNTER — Ambulatory Visit: Payer: BLUE CROSS/BLUE SHIELD | Admitting: Family Medicine

## 2017-05-25 ENCOUNTER — Encounter: Payer: Self-pay | Admitting: Family Medicine

## 2017-05-25 ENCOUNTER — Other Ambulatory Visit: Payer: Self-pay

## 2017-05-25 VITALS — BP 120/70 | HR 65 | Temp 97.7°F | Resp 16 | Ht 64.0 in | Wt 112.0 lb

## 2017-05-25 DIAGNOSIS — Z79899 Other long term (current) drug therapy: Secondary | ICD-10-CM

## 2017-05-25 DIAGNOSIS — L700 Acne vulgaris: Secondary | ICD-10-CM

## 2017-05-25 DIAGNOSIS — R760 Raised antibody titer: Secondary | ICD-10-CM

## 2017-05-25 DIAGNOSIS — M2550 Pain in unspecified joint: Secondary | ICD-10-CM | POA: Diagnosis not present

## 2017-05-25 DIAGNOSIS — R946 Abnormal results of thyroid function studies: Secondary | ICD-10-CM | POA: Diagnosis not present

## 2017-05-25 LAB — TSH: TSH: 0.96 mIU/L

## 2017-05-25 MED ORDER — TRETINOIN 0.1 % EX CREA: TOPICAL_CREAM | Freq: Every day | CUTANEOUS | 0 refills | Status: DC

## 2017-05-25 NOTE — Progress Notes (Signed)
Patient ID: Tina Haney, female    DOB: March 24, 1980, 37 y.o.   MRN: [id number]  Chief Complaint  Patient presents with  . Follow-up    Allergies Patient has no known allergies.  Subjective:   Tina Haney is a 37 y.o. female who presents to [facility] today.  HPI [name] presents today for follow-up and to get an EKG.  She was referred to get an EKG by Oneta Rack, ANP/GNP at [facility].  She needs an EKG in order to start stimulant medication to be treated for ADD.  She has no history of cardiovascular issues.  She has no family history of heart issues or sudden cardiac death.  She denies palpitations, shortness of breath, or chest pain.  She has no history of hypertension in the past.  She has been on stimulants in the past without side effects or problems.  She had been seen and evaluated by Dr. Gordy Clement.  She was told that she had arthritis but did not have a history consistent with autoimmune disease.  Patient reports she does not feel like her concerns were addressed.  She would like to get a second opinion at [facility].  She believes that due to the fact that she is at [facility] that she will receive more compassionate care at that facility.  Therefore, she does request for rheumatology consult from [facility].   She also wonders today if I should refer her to a Lyme disease specialist.  She reports that she has had some lab tests in the past that showed that she might have had Lyme disease and would like an evaluation. She also has concerns because she has had some acne rash on her face.  She reports that she has been breaking out with pimples and would like an acne medication.  She has had a history of Accutane use in the past.  She reports that she does use an all organic soap.  She uses a witch hazel and [place] toner each night.  She reports that she also does use sunscreen if she  is out in the sun.  Patient also has a history of abnormal thyroid function test.  She was told by one doctor that she had Hashimoto's thyroiditis and reports that by another doctor she was told that she did not.  She reports that her thyroid function was normal when it was last checked but she would like to have this rechecked again.  Reports she still has dry skin and fatigue that she believes is due to abnormal thyroid function.      Past Medical History:  Diagnosis Date  . Acute Epstein Barr virus (EBV) infection   . Chronic posttraumatic stress syndrome   . Concussion    09/2015,  Was 2nd or 3rd concussion.   . Esophagitis   . Herniated disc, cervical    C5/6 and 6/7   . Murmur, heart   . Polyp of colon   . PTSD (post-traumatic stress disorder)   . Thyroid nodule     Past Surgical History:  Procedure Laterality Date  . COLONOSCOPY     Done in [place], 2 polyps, benign  . DENTAL SURGERY    . UPPER GI ENDOSCOPY     recent EGD done in [place] by Dr. Uvaldo Rising    Family History  Problem Relation Age of Onset  . Bipolar disorder Mother   . Celiac disease Daughter   . Cancer Maternal  Grandmother   . Alzheimer's disease Maternal Grandmother   . Kidney disease Maternal Grandfather   . Migraines Father   . Dementia Father   . Eczema Son   . ADD / ADHD Daughter      Social History   Socioeconomic History  . Marital status: Married    Spouse name: Not on file  . Number of children: Not on file  . Years of education: Not on file  . Highest education level: Not on file  Occupational History  . Not on file  Social Needs  . Financial resource strain: Not on file  . Food insecurity:    Worry: Not on file    Inability: Not on file  . Transportation needs:    Medical: Not on file    Non-medical: Not on file  Tobacco Use  . Smoking status: Never Smoker  . Smokeless tobacco: Never Used  Substance and Sexual Activity  . Alcohol use: No    Frequency: Never  . Drug  use: No  . Sexual activity: Yes    Partners: Male    Birth control/protection: Surgical    Comment: husband vastectomy  Lifestyle  . Physical activity:    Days per week: Not on file    Minutes per session: Not on file  . Stress: Not on file  Relationships  . Social connections:    Talks on phone: Not on file    Gets together: Not on file    Attends religious service: Not on file    Active member of club or organization: Not on file    Attends meetings of clubs or organizations: Not on file    Relationship status: Not on file  Other Topics Concern  . Not on file  Social History Narrative   Born in [place]. Grew up in [place] and [place]. Married with four children. Married for 15 years-12, 11, 10, 7. Attend school at [employer].   Education: BS some masters work.  Work: just had to resign.  Caffiene: one cup of tea / day.    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   [place], [place].    Came back to the [place] in 07/2016.    Seen by Functional medicine doctor and had lots of labs done and put on lots of supplement.     Review of Systems  Constitutional: Positive for appetite change and fatigue. Negative for fever and unexpected weight change.  HENT: Negative for trouble swallowing and voice change.   Respiratory: Negative for cough, chest tightness, shortness of breath and wheezing.   Cardiovascular: Negative for chest pain, palpitations and leg swelling.  Skin: Negative for rash.  Hematological: Negative for adenopathy. Does not bruise/bleed easily.  Psychiatric/Behavioral: Negative for behavioral problems, confusion, dysphoric mood, self-injury and suicidal ideas.     Objective:   BP 120/70 (BP Location: Left Arm, Patient Position: Sitting, Cuff Size: Normal)   Pulse 65   Temp 97.7 F (36.5 C) (Temporal)   Resp 16   Ht 5\' 4"  (1.626 m)   Wt 112 lb (50.8 kg)   LMP 05/20/2017 (Approximate)   SpO2 98%   BMI 19.22  kg/m   Physical Exam  Constitutional: She appears well-developed and well-nourished.  Cardiovascular: Normal rate, regular rhythm, normal heart sounds and intact distal pulses. Exam reveals no gallop and no friction rub.  No murmur heard. Pulmonary/Chest: Effort normal and breath sounds normal.  Skin: Skin is warm.  Capillary refill takes less than 2 seconds.  Scattered open and closed comedones on face throughout with predominance on forehead, cheeks, and chin area.  Psychiatric: She has a normal mood and affect. Judgment and thought content normal.  Vitals reviewed.    Assessment and Plan  1. Abnormal thyroid function test Labs ordered today. - Thyroid peroxidase antibody - TSH  2. High risk medication use EKG performed in our office secondary to initiation of stimulant medication by psychiatry.  EKG with normal sinus rhythm and no abnormalities.  Will fax EKG to psychiatry. - PR ELECTROCARDIOGRAM, TRACING  3. Antinuclear antibody (ANA) titer greater than 1:80 Notes in chart by Dr. Merrilee Janskyesveshar were reviewed.  I was not able to review the advise testing labs that were done by rheumatology.  However there is a note in chart saying that all the testing was negative.  Will ask to get a copy of that from rheumatology.  At patient's request, referral to rheumatology for second opinion was placed.  I do believe that this is a reasonable reasonable request.  I did discuss with patient that her concerns for Lyme disease could be answered by rheumatology at that time. - Ambulatory referral to Rheumatology  4. Polyarthralgia - Ambulatory referral to Rheumatology  5. Acne vulgaris Patient was counseled concerning risks versus benefits of medication.  Discussed with patient that I believe her acne is secondary to stress, diet, increase even production, and hormones.  She does report that her acne is worse around her menses.  It was recommended that she start Cetaphil soap to cleanse her skin twice  a day.  Also recommended use of CeraVe products for moisturizing.  She was counseled regarding the proper administration of Retin-A.  We did discuss the increased sun sensitivity and skin dryness associated with this cream.  She voiced understanding.  We will titrate up medication in 2 months if needed. - tretinoin (RETIN-A) 0.1 % cream; Apply topically at bedtime.  Dispense: 45 g; Refill: 0  Office visit was greater than 25 minutes.  Greater than 50% spent counseling and coordinating care. No follow-ups on file. Aliene Beamsachel Becky Berberian, MD 05/25/2017

## 2017-05-26 ENCOUNTER — Encounter: Payer: Self-pay | Admitting: Family Medicine

## 2017-05-26 ENCOUNTER — Telehealth: Payer: Self-pay

## 2017-05-26 LAB — THYROID PEROXIDASE ANTIBODY

## 2017-05-26 NOTE — Telephone Encounter (Signed)
I have sent the labs as requested

## 2017-05-26 NOTE — Telephone Encounter (Signed)
The rheumatology office she was sent to for 2nd opinion is needing her lab results faxed to them.  Phone# [phone]

## 2017-05-29 DIAGNOSIS — F4323 Adjustment disorder with mixed anxiety and depressed mood: Secondary | ICD-10-CM | POA: Diagnosis not present

## 2017-05-30 DIAGNOSIS — M5022 Other cervical disc displacement, mid-cervical region, unspecified level: Secondary | ICD-10-CM | POA: Diagnosis not present

## 2017-05-30 DIAGNOSIS — M47816 Spondylosis without myelopathy or radiculopathy, lumbar region: Secondary | ICD-10-CM | POA: Diagnosis not present

## 2017-05-30 DIAGNOSIS — M47812 Spondylosis without myelopathy or radiculopathy, cervical region: Secondary | ICD-10-CM | POA: Diagnosis not present

## 2017-05-30 DIAGNOSIS — M546 Pain in thoracic spine: Secondary | ICD-10-CM | POA: Diagnosis not present

## 2017-06-01 ENCOUNTER — Ambulatory Visit: Payer: Self-pay | Admitting: Neurology

## 2017-06-01 DIAGNOSIS — F4310 Post-traumatic stress disorder, unspecified: Secondary | ICD-10-CM | POA: Diagnosis not present

## 2017-06-01 DIAGNOSIS — F411 Generalized anxiety disorder: Secondary | ICD-10-CM | POA: Diagnosis not present

## 2017-06-06 DIAGNOSIS — F4323 Adjustment disorder with mixed anxiety and depressed mood: Secondary | ICD-10-CM | POA: Diagnosis not present

## 2017-06-08 ENCOUNTER — Ambulatory Visit: Payer: BLUE CROSS/BLUE SHIELD | Admitting: Rheumatology

## 2017-06-13 ENCOUNTER — Telehealth: Payer: Self-pay | Admitting: Family Medicine

## 2017-06-13 DIAGNOSIS — M47816 Spondylosis without myelopathy or radiculopathy, lumbar region: Secondary | ICD-10-CM | POA: Diagnosis not present

## 2017-06-13 DIAGNOSIS — M546 Pain in thoracic spine: Secondary | ICD-10-CM | POA: Diagnosis not present

## 2017-06-13 DIAGNOSIS — M47812 Spondylosis without myelopathy or radiculopathy, cervical region: Secondary | ICD-10-CM | POA: Diagnosis not present

## 2017-06-13 DIAGNOSIS — M5022 Other cervical disc displacement, mid-cervical region, unspecified level: Secondary | ICD-10-CM | POA: Diagnosis not present

## 2017-06-13 NOTE — Telephone Encounter (Signed)
Patient called in to check the status of " prescription cream for her face"  Pharmacy: [facility] on [address]  Spoke with abby, medication requires prior approval and she is working on this.

## 2017-06-14 DIAGNOSIS — F4323 Adjustment disorder with mixed anxiety and depressed mood: Secondary | ICD-10-CM | POA: Diagnosis not present

## 2017-06-15 NOTE — Telephone Encounter (Signed)
Patient aware that med has been approved

## 2017-06-19 DIAGNOSIS — F4323 Adjustment disorder with mixed anxiety and depressed mood: Secondary | ICD-10-CM | POA: Diagnosis not present

## 2017-06-22 DIAGNOSIS — M5030 Other cervical disc degeneration, unspecified cervical region: Secondary | ICD-10-CM | POA: Diagnosis not present

## 2017-06-28 DIAGNOSIS — F4310 Post-traumatic stress disorder, unspecified: Secondary | ICD-10-CM | POA: Diagnosis not present

## 2017-06-28 DIAGNOSIS — F411 Generalized anxiety disorder: Secondary | ICD-10-CM | POA: Diagnosis not present

## 2017-06-29 DIAGNOSIS — F4323 Adjustment disorder with mixed anxiety and depressed mood: Secondary | ICD-10-CM | POA: Diagnosis not present

## 2017-07-04 DIAGNOSIS — R5383 Other fatigue: Secondary | ICD-10-CM | POA: Diagnosis not present

## 2017-07-04 DIAGNOSIS — M2550 Pain in unspecified joint: Secondary | ICD-10-CM | POA: Diagnosis not present

## 2017-07-05 DIAGNOSIS — F4323 Adjustment disorder with mixed anxiety and depressed mood: Secondary | ICD-10-CM | POA: Diagnosis not present

## 2017-07-06 DIAGNOSIS — M5030 Other cervical disc degeneration, unspecified cervical region: Secondary | ICD-10-CM | POA: Diagnosis not present

## 2017-07-06 DIAGNOSIS — M542 Cervicalgia: Secondary | ICD-10-CM | POA: Diagnosis not present

## 2017-07-07 ENCOUNTER — Encounter: Payer: Self-pay | Admitting: Family Medicine

## 2017-07-11 ENCOUNTER — Ambulatory Visit: Payer: BLUE CROSS/BLUE SHIELD | Admitting: Rheumatology

## 2017-07-18 DIAGNOSIS — M542 Cervicalgia: Secondary | ICD-10-CM | POA: Diagnosis not present

## 2017-07-19 DIAGNOSIS — M542 Cervicalgia: Secondary | ICD-10-CM | POA: Diagnosis not present

## 2017-07-24 DIAGNOSIS — M5030 Other cervical disc degeneration, unspecified cervical region: Secondary | ICD-10-CM | POA: Diagnosis not present

## 2017-07-24 DIAGNOSIS — M542 Cervicalgia: Secondary | ICD-10-CM | POA: Diagnosis not present

## 2017-07-24 DIAGNOSIS — G894 Chronic pain syndrome: Secondary | ICD-10-CM | POA: Diagnosis not present

## 2017-07-25 DIAGNOSIS — M542 Cervicalgia: Secondary | ICD-10-CM | POA: Diagnosis not present

## 2017-07-26 DIAGNOSIS — M5022 Other cervical disc displacement, mid-cervical region, unspecified level: Secondary | ICD-10-CM | POA: Diagnosis not present

## 2017-07-26 DIAGNOSIS — M47816 Spondylosis without myelopathy or radiculopathy, lumbar region: Secondary | ICD-10-CM | POA: Diagnosis not present

## 2017-07-26 DIAGNOSIS — M47812 Spondylosis without myelopathy or radiculopathy, cervical region: Secondary | ICD-10-CM | POA: Diagnosis not present

## 2017-07-26 DIAGNOSIS — M546 Pain in thoracic spine: Secondary | ICD-10-CM | POA: Diagnosis not present

## 2017-07-27 DIAGNOSIS — M542 Cervicalgia: Secondary | ICD-10-CM | POA: Diagnosis not present

## 2017-07-27 DIAGNOSIS — M5030 Other cervical disc degeneration, unspecified cervical region: Secondary | ICD-10-CM | POA: Diagnosis not present

## 2017-08-01 DIAGNOSIS — F411 Generalized anxiety disorder: Secondary | ICD-10-CM | POA: Diagnosis not present

## 2017-08-01 DIAGNOSIS — M5030 Other cervical disc degeneration, unspecified cervical region: Secondary | ICD-10-CM | POA: Diagnosis not present

## 2017-08-01 DIAGNOSIS — F4310 Post-traumatic stress disorder, unspecified: Secondary | ICD-10-CM | POA: Diagnosis not present

## 2017-08-04 DIAGNOSIS — M47812 Spondylosis without myelopathy or radiculopathy, cervical region: Secondary | ICD-10-CM | POA: Diagnosis not present

## 2017-08-04 DIAGNOSIS — M5022 Other cervical disc displacement, mid-cervical region, unspecified level: Secondary | ICD-10-CM | POA: Diagnosis not present

## 2017-08-04 DIAGNOSIS — M47816 Spondylosis without myelopathy or radiculopathy, lumbar region: Secondary | ICD-10-CM | POA: Diagnosis not present

## 2017-08-04 DIAGNOSIS — M546 Pain in thoracic spine: Secondary | ICD-10-CM | POA: Diagnosis not present

## 2017-08-07 DIAGNOSIS — F4323 Adjustment disorder with mixed anxiety and depressed mood: Secondary | ICD-10-CM | POA: Diagnosis not present

## 2017-08-10 ENCOUNTER — Ambulatory Visit (INDEPENDENT_AMBULATORY_CARE_PROVIDER_SITE_OTHER): Payer: BLUE CROSS/BLUE SHIELD | Admitting: Family Medicine

## 2017-08-10 ENCOUNTER — Encounter: Payer: Self-pay | Admitting: Family Medicine

## 2017-08-10 ENCOUNTER — Other Ambulatory Visit: Payer: Self-pay

## 2017-08-10 VITALS — BP 108/60 | HR 80 | Temp 98.9°F | Resp 16 | Ht 64.0 in | Wt 116.1 lb

## 2017-08-10 DIAGNOSIS — D509 Iron deficiency anemia, unspecified: Secondary | ICD-10-CM

## 2017-08-10 DIAGNOSIS — E559 Vitamin D deficiency, unspecified: Secondary | ICD-10-CM

## 2017-08-10 DIAGNOSIS — Z30018 Encounter for initial prescription of other contraceptives: Secondary | ICD-10-CM

## 2017-08-10 DIAGNOSIS — L700 Acne vulgaris: Secondary | ICD-10-CM | POA: Diagnosis not present

## 2017-08-10 LAB — POCT URINE PREGNANCY: Preg Test, Ur: NEGATIVE

## 2017-08-10 MED ORDER — NORETHIN ACE-ETH ESTRAD-FE 1-20 MG-MCG PO TABS: 1.0000 | ORAL_TABLET | Freq: Every day | ORAL | 11 refills | Status: DC

## 2017-08-10 MED ORDER — TRETINOIN 0.05 % EX CREA: TOPICAL_CREAM | Freq: Every day | CUTANEOUS | 0 refills | Status: DC

## 2017-08-10 NOTE — Patient Instructions (Signed)
Oral Contraception Information Oral contraceptive pills (OCPs) are medicines taken to prevent pregnancy. OCPs work by preventing the ovaries from releasing eggs. The hormones in OCPs also cause the cervical mucus to thicken, preventing the sperm from entering the uterus. The hormones also cause the uterine lining to become thin, not allowing a fertilized egg to attach to the inside of the uterus. OCPs are highly effective when taken exactly as prescribed. However, OCPs do not prevent sexually transmitted diseases (STDs). Safe sex practices, such as using condoms along with the pill, can help prevent STDs. Before taking the pill, you may have a physical exam and Pap test. Your health care provider may order blood tests. The health care provider will make sure you are a good candidate for oral contraception. Discuss with your health care provider the possible side effects of the OCP you may be prescribed. When starting an OCP, it can take 2 to 3 months for the body to adjust to the changes in hormone levels in your body. Types of oral contraception  The combination pill-This pill contains estrogen and progestin (synthetic progesterone) hormones. The combination pill comes in 21-day, 28-day, or 91-day packs. Some types of combination pills are meant to be taken continuously (365-day pills). With 21-day packs, you do not take pills for 7 days after the last pill. With 28-day packs, the pill is taken every day. The last 7 pills are without hormones. Certain types of pills have more than 21 hormone-containing pills. With 91-day packs, the first 84 pills contain both hormones, and the last 7 pills contain no hormones or contain estrogen only.  The minipill-This pill contains the progesterone hormone only. The pill is taken every day continuously. It is very important to take the pill at the same time each day. The minipill comes in packs of 28 pills. All 28 pills contain the hormone. Advantages of oral  contraceptive pills  Decreases premenstrual symptoms.  Treats menstrual period cramps.  Regulates the menstrual cycle.  Decreases a heavy menstrual flow.  May treatacne, depending on the type of pill.  Treats abnormal uterine bleeding.  Treats polycystic ovarian syndrome.  Treats endometriosis.  Can be used as emergency contraception. Things that can make oral contraceptive pills less effective OCPs can be less effective if:  You forget to take the pill at the same time every day.  You have a stomach or intestinal disease that lessens the absorption of the pill.  You take OCPs with other medicines that make OCPs less effective, such as antibiotics, certain HIV medicines, and some seizure medicines.  You take expired OCPs.  You forget to restart the pill on day 7, when using the packs of 21 pills.  Risks associated with oral contraceptive pills Oral contraceptive pills can sometimes cause side effects, such as:  Headache.  Nausea.  Breast tenderness.  Irregular bleeding or spotting.  Combination pills are also associated with a small increased risk of:  Blood clots.  Heart attack.  Stroke.  This information is not intended to replace advice given to you by your health care provider. Make sure you discuss any questions you have with your health care provider. Document Released: 04/09/2002 Document Revised: 06/25/2015 Document Reviewed: 07/08/2012 Elsevier Interactive Patient Education  2018 Elsevier Inc.  

## 2017-08-10 NOTE — Progress Notes (Signed)
Patient ID: Tina Haney, female    DOB: [birth year], 37 y.o.   MRN: [id number]  Chief Complaint  Patient presents with  . skin issues    face. cream isn't working    Allergies Patient has no known allergies.  Subjective:   Tina Haney is a 37 y.o. female who presents to [facility] today.  HPI Here for follow up. Has been seeing by psychiatry and is back on ADD medications. Has started counseling and doing better. Uses the buspar if needed. Taking concerta qd for ADD and it does help with fatigue some.  Saw Dr. Nelva Bush for back pain and then has seen Dr. Gillie Manners and has gotten some injections to help with the pain and numbness. Is considering surgery for the bone spurs in neck and disc herniation. Reports that does not wake up rested b/c of pain and believes that is causing the fatigue.  Reports that two of her children have the HLA-DQ2 for celiac. 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 Reports that she has read and found out that the HLA-DQ5 is associated with liver issues and wants to know if her liver was checked.  Reports that at [facility] at [facility]/Dr. Sharyn Dross is going to look over her celiac issues.  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 Reports that has autoimmune unspecified.  Reports that has been following the type of diet that she should. Is concerned about her acne. Would like to get her testosterone levels checked b/c face continually breaks out. Wonders if her testosterone levels are elevated. Wonders if she should be on birth control. Reports that pimples/acne gets worse around menses but they are always present.  Does not have an OB/Gyn. Would like to consider starting on OCP. Menses are monthly, regular. They last about 3 days. Has not had any problems with menses. No abnormal pap smears. Last pap smear was quite a few years ago would like to be seen  by gynecology for this.  No family history of breast cancer other than that she reports that maternal grandmother has had breast cancer at an older age, she is still living.  Has been on birth control pills in the past with no problems.  Denies tobacco use. No blood clots or HTN. No DVT or HTN.  Reports she would also like a referral to dermatology.  Has been using the Retin-A cream 0.1% and it is too drying for her face.  He when she uses it every other day it tends to make her face very dry.  Is very bothered by the fact that her skin is broken out and she is dealing with acne.  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  Would like to be on the birth control pills to help with her acne.  Reports that she does not have any problems with her menses as in the fact that they can be a little bit heavy the first couple days.   Past Medical History:  Diagnosis Date  . Acute Epstein Barr virus (EBV) infection   . Chronic posttraumatic stress syndrome   . Concussion    09/2015,  Was 2nd or 3rd concussion.   . Esophagitis   . Herniated disc, cervical    C5/6 and 6/7   . Murmur, heart   .  Polyp of colon   . PTSD (post-traumatic stress disorder)   . Thyroid nodule     Past Surgical History:  Procedure Laterality Date  . COLONOSCOPY     Done in [place], 2 polyps, benign  . DENTAL SURGERY    . UPPER GI ENDOSCOPY     recent EGD done in [place] by Dr. Burgess Estelle    Family History  Problem Relation Age of Onset  . Bipolar disorder Mother   . Celiac disease Daughter   . Cancer Maternal Grandmother   . Alzheimer's disease Maternal Grandmother   . Kidney disease Maternal Grandfather   . Migraines Father   . Dementia Father   . Eczema Son   . ADD / ADHD Daughter      Social History   Socioeconomic History  . Marital status: Married    Spouse name: Not on file  . Number of children: Not on file  . Years of education: Not on file   . Highest education level: Not on file  Occupational History  . Not on file  Social Needs  . Financial resource strain: Not on file  . Food insecurity:    Worry: Not on file    Inability: Not on file  . Transportation needs:    Medical: Not on file    Non-medical: Not on file  Tobacco Use  . Smoking status: Never Smoker  . Smokeless tobacco: Never Used  Substance and Sexual Activity  . Alcohol use: No    Frequency: Never  . Drug use: No  . Sexual activity: Yes    Partners: Male    Birth control/protection: Surgical    Comment: husband vastectomy  Lifestyle  . Physical activity:    Days per week: Not on file    Minutes per session: Not on file  . Stress: Not on file  Relationships  . Social connections:    Talks on phone: Not on file    Gets together: Not on file    Attends religious service: Not on file    Active member of club or organization: Not on file    Attends meetings of clubs or organizations: Not on file    Relationship status: Not on file  Other Topics Concern  . Not on file  Social History Narrative   Born in [place]. Grew up in [place] and [place]. Married with four children. Married for 15 years-12, 11, 10, 7. Attend school at [employer].   Education: BS some masters work.  Work: just had to resign.  Caffiene: one cup of tea / day.    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    Came back to the [place] in 07/2016.    Seen by Functional medicine doctor and had lots of labs done and put on lots of supplement.    Current Outpatient Medications on File Prior to Visit  Medication Sig Dispense Refill  . busPIRone (BUSPAR) 15 MG tablet Take 0.5-1 tablets by mouth 2 (two) times daily as needed.  0  . CALCIUM PO Take 600 mg by mouth daily.    . CONCERTA 36 MG CR tablet Take 36 mg by mouth daily.  0  . LYSINE PO Take 1 each by mouth daily.     . methylphenidate (RITALIN) 10 MG tablet Take  10 mg by mouth 2 (two) times daily.    . Omega-3 Fatty Acids (FISH OIL PO) Take 1  capsule by mouth 3 (three) times daily.    Marland Kitchen OVER THE COUNTER MEDICATION Take 2 tablets by mouth 3 (three) times daily. Ultra Preventive    . OVER THE COUNTER MEDICATION Take 1 Dose by mouth daily.     Marland Kitchen OVER THE COUNTER MEDICATION     . OVER THE COUNTER MEDICATION daily.    . TURMERIC PO Take 1 Dose by mouth 3 (three) times daily.     No current facility-administered medications on file prior to visit.     Review of Systems  Constitutional: Positive for fatigue. Negative for activity change, appetite change and fever.  Eyes: Negative for visual disturbance.  Respiratory: Negative for cough, chest tightness and shortness of breath.   Cardiovascular: Negative for chest pain, palpitations and leg swelling.  Gastrointestinal: Negative for abdominal pain, nausea and vomiting.  Genitourinary: Negative for dysuria, frequency and urgency.  Musculoskeletal: Positive for arthralgias and neck pain.       Joint and body aches which is followed by rheumatology and orthopedics.  Skin:       Reports acne on face.  Neurological: Negative for dizziness, tremors, syncope, light-headedness and headaches.  Hematological: Negative for adenopathy.  Psychiatric/Behavioral:       Reports that her anxiety is improved.  Followed by psychiatry.     Objective:   BP 108/60 (BP Location: Left Arm, Patient Position: Sitting, Cuff Size: Normal)   Pulse 80   Temp 98.9 F (37.2 C) (Temporal)   Resp 16   Ht '5\' 4"'$  (1.626 m)   Wt 116 lb 1.9 oz (52.7 kg)   SpO2 98%   BMI 19.93 kg/m   Physical Exam  Constitutional: She appears well-developed and well-nourished.  Eyes: Pupils are equal, round, and reactive to light. Conjunctivae are normal. No scleral icterus.  Cardiovascular: Normal rate, regular rhythm and normal heart sounds.  Pulmonary/Chest: Effort normal.  Skin: Capillary refill takes less than 2 seconds.  Closed  comedones on face, scattered throughout forehead, cheeks, and chin with associated erythema.  Evidence of previous acne scars on face throughout.  Scattered areas of hyperpigmentation from scarring.  Dry areas/scaling at corners of lips and nose.  Psychiatric: She has a normal mood and affect. Her behavior is normal. Thought content normal.  Vitals reviewed.    Assessment and Plan  1. Acne vulgaris Will decrease strength of Retin-A due to side effects.  Start oral contraceptive pills for acne.  Referral to dermatology placed. - tretinoin (RETIN-A) 0.05 % cream; Apply topically at bedtime.  Dispense: 45 g; Refill: 0 - Ambulatory referral to Dermatology -Skin care precautions, sun avoidance, and acne discussed with patient in detail. 2. Hormonal contraceptive  Mechanism of action, directions of use, risks and benefits discussed with patient.  She voiced understanding. - Ambulatory referral to Gynecology - POCT urine pregnancy - norethindrone-ethinyl estradiol (LOESTRIN FE 1/20) 1-20 MG-MCG tablet; Take 1 tablet by mouth daily.  Dispense: 1 Package; Refill: 11 She will follow-up with GYN for Pap, pelvic, complete breast exam and hormonal contraceptive management.   3. Vitamin D deficiency Continue supplementation.  Check levels. - VITAMIN D 25 Hydroxy (Vit-D Deficiency, Fractures)  4. Iron deficiency anemia, unspecified iron deficiency anemia type Check levels. - CBC with Differential/Platelet - Iron, TIBC and Ferritin Panel Office visit greater than 25 minutes.  Greater than 50% spent counseling and coordinating care. Return in about 3 months (around 11/10/2017).  Patient will establish with new PCP office.  Keep scheduled follow-ups with gastroenterology, rheumatology, orthopedics, and  psychiatry. Caren Macadam, MD 08/10/2017

## 2017-08-11 ENCOUNTER — Encounter: Payer: Self-pay | Admitting: Family Medicine

## 2017-08-11 LAB — CBC WITH DIFFERENTIAL/PLATELET
Basophils Absolute: 41 cells/uL (ref 0–200)
Basophils Relative: 0.9 %
EOS ABS: 51 {cells}/uL (ref 15–500)
Eosinophils Relative: 1.1 %
HCT: 37.2 % (ref 35.0–45.0)
HEMOGLOBIN: 12.7 g/dL (ref 11.7–15.5)
Lymphs Abs: 1578 cells/uL (ref 850–3900)
MCH: 29.2 pg (ref 27.0–33.0)
MCHC: 34.1 g/dL (ref 32.0–36.0)
MCV: 85.5 fL (ref 80.0–100.0)
MONOS PCT: 8.8 %
MPV: 10.3 fL (ref 7.5–12.5)
NEUTROS ABS: 2525 {cells}/uL (ref 1500–7800)
Neutrophils Relative %: 54.9 %
PLATELETS: 255 10*3/uL (ref 140–400)
RBC: 4.35 10*6/uL (ref 3.80–5.10)
RDW: 11.5 % (ref 11.0–15.0)
TOTAL LYMPHOCYTE: 34.3 %
WBC: 4.6 10*3/uL (ref 3.8–10.8)
WBCMIX: 405 {cells}/uL (ref 200–950)

## 2017-08-11 LAB — IRON,TIBC AND FERRITIN PANEL
%SAT: 29 % (calc) (ref 16–45)
FERRITIN: 10 ng/mL — AB (ref 16–154)
Iron: 113 ug/dL (ref 40–190)
TIBC: 388 mcg/dL (calc) (ref 250–450)

## 2017-08-11 LAB — VITAMIN D 25 HYDROXY (VIT D DEFICIENCY, FRACTURES): VIT D 25 HYDROXY: 47 ng/mL (ref 30–100)

## 2017-08-12 DIAGNOSIS — M5030 Other cervical disc degeneration, unspecified cervical region: Secondary | ICD-10-CM | POA: Diagnosis not present

## 2017-08-16 DIAGNOSIS — F4323 Adjustment disorder with mixed anxiety and depressed mood: Secondary | ICD-10-CM | POA: Diagnosis not present

## 2017-08-17 DIAGNOSIS — M546 Pain in thoracic spine: Secondary | ICD-10-CM | POA: Diagnosis not present

## 2017-08-17 DIAGNOSIS — M47812 Spondylosis without myelopathy or radiculopathy, cervical region: Secondary | ICD-10-CM | POA: Diagnosis not present

## 2017-08-17 DIAGNOSIS — M5022 Other cervical disc displacement, mid-cervical region, unspecified level: Secondary | ICD-10-CM | POA: Diagnosis not present

## 2017-08-17 DIAGNOSIS — M47816 Spondylosis without myelopathy or radiculopathy, lumbar region: Secondary | ICD-10-CM | POA: Diagnosis not present

## 2017-08-19 DIAGNOSIS — M5030 Other cervical disc degeneration, unspecified cervical region: Secondary | ICD-10-CM | POA: Diagnosis not present

## 2017-08-30 DIAGNOSIS — F4323 Adjustment disorder with mixed anxiety and depressed mood: Secondary | ICD-10-CM | POA: Diagnosis not present

## 2017-08-31 DIAGNOSIS — M47816 Spondylosis without myelopathy or radiculopathy, lumbar region: Secondary | ICD-10-CM | POA: Diagnosis not present

## 2017-08-31 DIAGNOSIS — M546 Pain in thoracic spine: Secondary | ICD-10-CM | POA: Diagnosis not present

## 2017-08-31 DIAGNOSIS — M5022 Other cervical disc displacement, mid-cervical region, unspecified level: Secondary | ICD-10-CM | POA: Diagnosis not present

## 2017-08-31 DIAGNOSIS — M47812 Spondylosis without myelopathy or radiculopathy, cervical region: Secondary | ICD-10-CM | POA: Diagnosis not present

## 2017-09-04 ENCOUNTER — Encounter: Payer: Self-pay | Admitting: Adult Health

## 2017-09-04 ENCOUNTER — Ambulatory Visit (INDEPENDENT_AMBULATORY_CARE_PROVIDER_SITE_OTHER): Payer: BLUE CROSS/BLUE SHIELD | Admitting: Adult Health

## 2017-09-04 ENCOUNTER — Other Ambulatory Visit (HOSPITAL_COMMUNITY)
Admission: RE | Admit: 2017-09-04 | Discharge: 2017-09-04 | Disposition: A | Discharge status: Home / Self Care | Payer: BLUE CROSS/BLUE SHIELD | Source: Ambulatory Visit | Attending: Adult Health | Admitting: Adult Health

## 2017-09-04 VITALS — BP 110/70 | HR 70 | Ht 64.0 in | Wt 119.0 lb

## 2017-09-04 DIAGNOSIS — L709 Acne, unspecified: Secondary | ICD-10-CM

## 2017-09-04 DIAGNOSIS — L659 Nonscarring hair loss, unspecified: Secondary | ICD-10-CM | POA: Insufficient documentation

## 2017-09-04 DIAGNOSIS — Z01419 Encounter for gynecological examination (general) (routine) without abnormal findings: Secondary | ICD-10-CM | POA: Insufficient documentation

## 2017-09-04 NOTE — Progress Notes (Signed)
Patient ID: Tina DuffKindra Haney, female   DOB: 10-Sep-1980, 37 y.o.   MRN: [id number] History of Present Illness: Tina GunningKindra is a 37 year old white female, married, G4P4, new to this practice, in for a well woman gyn exam and pap.She says she has health issues, has lived in [place] over 10 years with husband as IT sales professionalmissionary, now he Radiation protection practitionerpreaches at [employer] in [place]. PCP is Dr Tracie HarrierHagler.    Current Medications, Allergies, Past Medical History, Past Surgical History, Family History and Social History were reviewed in [facility] electronic medical record.     Review of Systems: Patient denies any headaches, hearing loss, fatigue, blurred vision, shortness of breath, chest pain, abdominal pain, problems with bowel movements, urination, or intercourse. Has had hair loss and acne and was placed on retin A and OCs by PCP.Husband had vasectomy.  Has joint and body aches, ?related to auto immune disease, has seen rheumatologist.  Has low Ferritin level, thyroid was normal.    Physical Exam:BP 110/70 (BP Location: Left Arm, Patient Position: Sitting, Cuff Size: Normal)   Pulse 70   Ht 5\' 4"  (1.626 m)   Wt 119 lb (54 kg)   LMP 08/14/2017 (Approximate)   BMI 20.43 kg/m  General:  Well developed, well nourished, no acute distress Skin:  Warm and dry,has some acne on face, +tatoos Neck:  Midline trachea, normal thyroid, good ROM, no lymphadenopathy Lungs; Clear to auscultation bilaterally Breast:  No dominant palpable mass, retraction, or nipple discharge Cardiovascular: Regular rate and rhythm Abdomen:  Soft, non tender, no hepatosplenomegaly Pelvic:  External genitalia is normal in appearance, no lesions.  The vagina is normal in appearance. Urethra has no lesions or masses. The cervix is bulbous.Pap with HPV performed.  Uterus is felt to be normal size, shape, and contour.  No adnexal masses or tenderness noted.Bladder is non tender, no masses felt. Extremities/musculoskeletal:  No swelling or  varicosities noted, no clubbing or cyanosis Psych:  No mood changes, alert and cooperative,seems happy PHQ 9 score 3.   Impression:  1. Encounter for gynecological examination with Papanicolaou smear of cervix   2. Hair loss   3. Acne, unspecified acne type      Plan: Pap with HPV sent Physical in 1 year  Pap in 3 if normal Mammogram at 39 F/U with Dr Tracie HarrierHagler, as scheduled, ask about labs for Lyme's Disease Put hair up at night to get off face and give the OCs 3 months, if not better, may want to see dermatologist

## 2017-09-04 NOTE — Addendum Note (Signed)
Addended by: Federico FlakeNES, Berneta Sconyers A on: 09/04/2017 12:13 PM   Modules accepted: Orders

## 2017-09-05 DIAGNOSIS — F4323 Adjustment disorder with mixed anxiety and depressed mood: Secondary | ICD-10-CM | POA: Diagnosis not present

## 2017-09-06 LAB — CYTOLOGY - PAP
DIAGNOSIS: NEGATIVE
HPV: NOT DETECTED

## 2017-09-18 DIAGNOSIS — F4323 Adjustment disorder with mixed anxiety and depressed mood: Secondary | ICD-10-CM | POA: Diagnosis not present

## 2017-09-26 DIAGNOSIS — M5481 Occipital neuralgia: Secondary | ICD-10-CM | POA: Diagnosis not present

## 2017-09-27 DIAGNOSIS — F4323 Adjustment disorder with mixed anxiety and depressed mood: Secondary | ICD-10-CM | POA: Diagnosis not present

## 2017-10-03 DIAGNOSIS — F4323 Adjustment disorder with mixed anxiety and depressed mood: Secondary | ICD-10-CM | POA: Diagnosis not present

## 2017-10-03 DIAGNOSIS — F411 Generalized anxiety disorder: Secondary | ICD-10-CM | POA: Diagnosis not present

## 2017-10-03 DIAGNOSIS — F4310 Post-traumatic stress disorder, unspecified: Secondary | ICD-10-CM | POA: Diagnosis not present

## 2017-10-09 DIAGNOSIS — F4323 Adjustment disorder with mixed anxiety and depressed mood: Secondary | ICD-10-CM | POA: Diagnosis not present

## 2017-10-10 DIAGNOSIS — M542 Cervicalgia: Secondary | ICD-10-CM | POA: Diagnosis not present

## 2017-10-17 DIAGNOSIS — M542 Cervicalgia: Secondary | ICD-10-CM | POA: Diagnosis not present

## 2017-10-17 DIAGNOSIS — F4323 Adjustment disorder with mixed anxiety and depressed mood: Secondary | ICD-10-CM | POA: Diagnosis not present

## 2017-10-20 DIAGNOSIS — M542 Cervicalgia: Secondary | ICD-10-CM | POA: Diagnosis not present

## 2017-10-23 DIAGNOSIS — M542 Cervicalgia: Secondary | ICD-10-CM | POA: Diagnosis not present

## 2017-10-26 DIAGNOSIS — M542 Cervicalgia: Secondary | ICD-10-CM | POA: Diagnosis not present

## 2017-10-26 DIAGNOSIS — F4323 Adjustment disorder with mixed anxiety and depressed mood: Secondary | ICD-10-CM | POA: Diagnosis not present

## 2017-10-27 DIAGNOSIS — M542 Cervicalgia: Secondary | ICD-10-CM | POA: Diagnosis not present

## 2017-10-31 DIAGNOSIS — F4323 Adjustment disorder with mixed anxiety and depressed mood: Secondary | ICD-10-CM | POA: Diagnosis not present

## 2017-10-31 DIAGNOSIS — M542 Cervicalgia: Secondary | ICD-10-CM | POA: Diagnosis not present

## 2017-11-06 DIAGNOSIS — M542 Cervicalgia: Secondary | ICD-10-CM | POA: Diagnosis not present

## 2017-11-07 DIAGNOSIS — N949 Unspecified condition associated with female genital organs and menstrual cycle: Secondary | ICD-10-CM | POA: Diagnosis not present

## 2017-11-07 DIAGNOSIS — R5383 Other fatigue: Secondary | ICD-10-CM | POA: Diagnosis not present

## 2017-11-09 DIAGNOSIS — F4323 Adjustment disorder with mixed anxiety and depressed mood: Secondary | ICD-10-CM | POA: Diagnosis not present

## 2017-11-13 DIAGNOSIS — Z01818 Encounter for other preprocedural examination: Secondary | ICD-10-CM | POA: Diagnosis not present

## 2017-11-13 DIAGNOSIS — M542 Cervicalgia: Secondary | ICD-10-CM | POA: Diagnosis not present

## 2017-11-14 DIAGNOSIS — F4323 Adjustment disorder with mixed anxiety and depressed mood: Secondary | ICD-10-CM | POA: Diagnosis not present

## 2017-11-15 DIAGNOSIS — M542 Cervicalgia: Secondary | ICD-10-CM | POA: Diagnosis not present

## 2017-11-20 NOTE — Pre-Procedure Instructions (Signed)
[facility]  11/20/2017      [id number] - [place], [place] - [address] [place] [address] Phone: [phone] Fax: [phone]  [id number] - [place], [place] - [address] AT [facility] [address] [place] [address] Phone: [phone] Fax: [phone]  [facility] - [place],  - [address] [place] [address] Phone: [phone] Fax: [phone]    Your procedure is scheduled on Oct. 31  Report to [facility] Admitting at 5:30  A.M.  Call this number if you have problems the morning of surgery:  [phone]   Remember:  Do not eat or drink after midnight.      Take these medicines the morning of surgery with A SIP OF WATER :              Eye drops             Buspirone (buspar)             junel           7 days prior to surgery STOP taking any Aspirin(unless otherwise instructed by your surgeon), Aleve, Naproxen, Ibuprofen, Motrin, Advil, Goody's, BC's, all herbal medications, fish oil, and all vitamins    Do not wear jewelry, make-up or nail polish.  Do not wear lotions, powders, or perfumes, or deodorant.  Do not shave 48 hours prior to surgery.  Men may shave face and neck.  Do not bring valuables to the hospital.  [facility] is not responsible for any belongings or valuables.  Contacts, dentures or bridgework may not be worn into surgery.  Leave your suitcase in the car.  After surgery it may be brought to your room.  For patients admitted to the hospital, discharge time will be determined by your treatment team.  Patients discharged the day of surgery will not be allowed to drive home.    Special instructions:  [facility]- Preparing For Surgery  Before surgery, you can play an important role. Because skin is not sterile, your skin needs to be as free of germs as possible. You can reduce the number  of germs on your skin by washing with CHG (chlorahexidine gluconate) Soap before surgery.  CHG is an antiseptic cleaner which kills germs and bonds with the skin to continue killing germs even after washing.    Oral Hygiene is also important to reduce your risk of infection.  Remember - BRUSH YOUR TEETH THE MORNING OF SURGERY WITH YOUR REGULAR TOOTHPASTE  Please do not use if you have an allergy to CHG or antibacterial soaps. If your skin becomes reddened/irritated stop using the CHG.  Do not shave (including legs and underarms) for at least 48 hours prior to first CHG shower. It is OK to shave your face.  Please follow these instructions carefully.   1. Shower the NIGHT BEFORE SURGERY and the MORNING OF SURGERY with CHG.   2. If you chose to wash your hair, wash your hair first as usual with your normal shampoo.  3. After you shampoo, rinse your hair and body thoroughly to remove the shampoo.  4. Use CHG as you would any other liquid soap. You can apply CHG directly to the skin and wash gently with a scrungie or a clean washcloth.   5. Apply the CHG Soap to your body ONLY  FROM THE NECK DOWN.  Do not use on open wounds or open sores. Avoid contact with your eyes, ears, mouth and genitals (private parts). Wash Face and genitals (private parts)  with your normal soap.  6. Wash thoroughly, paying special attention to the area where your surgery will be performed.  7. Thoroughly rinse your body with warm water from the neck down.  8. DO NOT shower/wash with your normal soap after using and rinsing off the CHG Soap.  9. Pat yourself dry with a CLEAN TOWEL.  10. Wear CLEAN PAJAMAS to bed the night before surgery, wear comfortable clothes the morning of surgery  11. Place CLEAN SHEETS on your bed the night of your first shower and DO NOT SLEEP WITH PETS.    Day of Surgery:  Do not apply any deodorants/lotions.  Please wear clean clothes to the hospital/surgery center.   Remember to  brush your teeth WITH YOUR REGULAR TOOTHPASTE.    Please read over the following fact sheets that you were given. Coughing and Deep Breathing, MRSA Information and Surgical Site Infection Prevention

## 2017-11-21 ENCOUNTER — Encounter (HOSPITAL_COMMUNITY): Payer: Self-pay

## 2017-11-21 ENCOUNTER — Encounter (HOSPITAL_COMMUNITY)
Admission: RE | Admit: 2017-11-21 | Discharge: 2017-11-21 | Disposition: A | Discharge status: Home / Self Care | Payer: BLUE CROSS/BLUE SHIELD | Source: Ambulatory Visit | Attending: Orthopedic Surgery | Admitting: Orthopedic Surgery

## 2017-11-21 ENCOUNTER — Other Ambulatory Visit: Payer: Self-pay

## 2017-11-21 DIAGNOSIS — Z01812 Encounter for preprocedural laboratory examination: Secondary | ICD-10-CM | POA: Diagnosis not present

## 2017-11-21 HISTORY — DX: Headache, unspecified: R51.9

## 2017-11-21 HISTORY — DX: Headache: R51

## 2017-11-21 HISTORY — DX: Unspecified osteoarthritis, unspecified site: M19.90

## 2017-11-21 HISTORY — DX: Anemia, unspecified: D64.9

## 2017-11-21 HISTORY — DX: Gastro-esophageal reflux disease without esophagitis: K21.9

## 2017-11-21 LAB — CBC
HCT: 39.5 % (ref 36.0–46.0)
Hemoglobin: 12.4 g/dL (ref 12.0–15.0)
MCH: 27.6 pg (ref 26.0–34.0)
MCHC: 31.4 g/dL (ref 30.0–36.0)
MCV: 88 fL (ref 80.0–100.0)
Platelets: 365 10*3/uL (ref 150–400)
RBC: 4.49 MIL/uL (ref 3.87–5.11)
RDW: 12.9 % (ref 11.5–15.5)
WBC: 5.2 10*3/uL (ref 4.0–10.5)
nRBC: 0 % (ref 0.0–0.2)

## 2017-11-21 LAB — SURGICAL PCR SCREEN
MRSA, PCR: NEGATIVE
Staphylococcus aureus: NEGATIVE

## 2017-11-21 NOTE — Progress Notes (Signed)
PCP: Dr. Rita Ohara @ [facility]

## 2017-11-24 DIAGNOSIS — M542 Cervicalgia: Secondary | ICD-10-CM | POA: Diagnosis not present

## 2017-11-24 NOTE — H&P (Addendum)
Patient ID: Tina Haney MRN: [id number] DOB/AGE: [birth year] 36 y.o.  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  Will be undergoing anterior cervical discectomy and fusion of C5-7 with Dr. Shon Baton at [facility] on 31 October.  Pt has a marker for Celiacs Disease.  Patient is under the care of a rheumatologist.  She has been cleared for surgical intervention.  Past Medical History: Past Medical History:  Diagnosis Date  . Acute Epstein Barr virus (EBV) infection   . Anemia   . Arthritis   . Chronic posttraumatic stress syndrome   . Concussion    09/2015,  Was 2nd or 3rd concussion.   . Esophagitis   . GERD (gastroesophageal reflux disease)   . Headache   . Herniated disc, cervical    C5/6 and 6/7   . Murmur, heart    as a child   . Polyp of colon   . PTSD (post-traumatic stress disorder)   . Thyroid nodule     Surgical History: Past Surgical History:  Procedure Laterality Date  . COLONOSCOPY     Done in [place], 2 polyps, benign  . DENTAL SURGERY    . UPPER GI ENDOSCOPY     recent EGD done in [place] by Dr. Uvaldo Rising    Family History: Family History  Problem Relation Age of Onset  . Bipolar disorder Mother   . ADD / ADHD Mother   . Celiac disease Daughter   . Cancer Maternal Grandmother   . Alzheimer's disease Maternal Grandmother   . Kidney disease Maternal Grandfather   . Migraines Father   . Dementia Father   . Eczema Son   . ADD / ADHD Son   . ADD / ADHD Daughter     Social History: Social History   Socioeconomic History  . Marital status: Married    Spouse name: Not on file  . Number of children: Not on file  . Years of education: Not on file  . Highest education level: Not on file  Occupational History  . Not on file  Social Needs  .  Financial resource strain: Not on file  . Food insecurity:    Worry: Not on file    Inability: Not on file  . Transportation needs:    Medical: Not on file    Non-medical: Not on file  Tobacco Use  . Smoking status: Never Smoker  . Smokeless tobacco: Never Used  Substance and Sexual Activity  . Alcohol use: No    Frequency: Never  . Drug use: No  . Sexual activity: Yes    Partners: Male    Birth control/protection: Surgical, Pill    Comment: husband vastectomy  Lifestyle  . Physical activity:    Days per week: Not on file    Minutes per session: Not on file  . Stress: Not on file  Relationships  . Social connections:    Talks on phone: Not on file    Gets together: Not on file    Attends religious service: Not on file    Active member of club or organization: Not on file    Attends meetings of clubs or organizations: Not on file    Relationship status: Not on file  . Intimate partner violence:    Fear of current or ex partner: Not on file  Emotionally abused: Not on file    Physically abused: Not on file    Forced sexual activity: Not on file  Other Topics Concern  . Not on file  Social History Narrative   Born in [place]. Grew up in [place] and [place]. Married with four children. Married for 15 years-12, 11, 10, 7. Attend school at [employer].   Education: BS some masters work.  Work: just had to resign.  Caffiene: one cup of tea / day.    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   [place], [place].    Came back to the [place] in 07/2016.    Seen by Functional medicine doctor and had lots of labs done and put on lots of supplement.     Allergies: Gluten meal  Medications: I have reviewed the patient's current medications.  Vital Signs: No data found.  Radiology: No results found.  Labs: Recent Labs    11/21/17 1037  WBC 5.2  RBC 4.49  HCT 39.5  PLT 365   No results for input(s): NA, K, CL, CO2,  BUN, CREATININE, GLUCOSE, CALCIUM in the last 72 hours. No results for input(s): LABPT, INR in the last 72 hours.  Review of Systems: ROS  Physical Exam: There is no height or weight on file to calculate BMI.  Physical Exam  Constitutional: She is oriented to person, place, and time. She appears well-developed and well-nourished.  Cardiovascular: Normal rate and regular rhythm.  Respiratory: Effort normal and breath sounds normal.  GI: Soft. Bowel sounds are normal.  Neurological: She is alert and oriented to person, place, and time.  Skin: Skin is warm and dry.  Psychiatric: She has a normal mood and affect. Her behavior is normal. Judgment and thought content normal.   Clinical exam: Patient is alert and oriented 3.  No shortness of breath or chest pain. Abdomen: Soft and nontender.  No incontinence of bowel and bladder.  No rebound tenderness Neck: She continues to have significant axillary neck pain and occipital headaches. Neuro: She continues to have axillary neck pain that radiates into the trapezius and occasionally into both upper extremities.  With dysesthesias.  No focal motor deficits. Reflexes: Hoffman: Negative Babinski: Negative Gait: Normal Musculoskeletal: No significant pain with passive range of motion of the shoulder, elbow, wrist.  Imaging: No imaging studies taken at this visit.  Patient had left C7 selective nerve root block on 08/09/17: Patient may have had some transient positive effect but overall her quality of life did not significantly improve.  She continues to have severe neuropathic left C7 and right C6 arm pain.  The right side is gotten progressively worse since her last visit on 08/19/17.  She continues to have numbness and dysesthesias and neuropathic pain but no focal motor deficits.  The patient's MRI from July 26, 2017 is significant for moderate to severe left moderate right C6-7 foraminal narrowing impinging exiting left C7 nerve root moderate to  severe right mild left C5-6 neural foraminal narrowing moderate right impingement of C6 nerve root mild flattening of the cord.   Assessment and Plan: Risks and benefits of surgery were discussed with the patient. These include: Infection, bleeding, death, stroke, paralysis, ongoing or worse pain, need for additional surgery, nonunion, leak of spinal fluid, adjacent segment degeneration requiring additional fusion surgery. Pseudoarthrosis (nonunion)requiring supplemental posterior fixation. Throat pain, swallowing difficulties, hoarseness or change in voice.  With respect to disc replacement: Additional risks include  heterotopic ossification, inability to place the disc due to technical issues requiring bailout to a fusion procedure.  Anette Riedel, PAC for Venita Lick, MD [facility] [phone]  There is been no change in the patient's clinical exam since her last office visit of 11/24/2017.  She continues to have some bilateral radicular arm pain in the C6 and C7 dermatome.  Because of the failure conservative care and the chronicity of her pain we have elected to move forward with surgical intervention.  Plan on doing a 2 level ACDF.  Risks and benefits were explained to the patient and her husband again and all their questions were encouraged and addressed.

## 2017-11-28 DIAGNOSIS — F4310 Post-traumatic stress disorder, unspecified: Secondary | ICD-10-CM | POA: Diagnosis not present

## 2017-11-28 DIAGNOSIS — F411 Generalized anxiety disorder: Secondary | ICD-10-CM | POA: Diagnosis not present

## 2017-11-28 DIAGNOSIS — F4323 Adjustment disorder with mixed anxiety and depressed mood: Secondary | ICD-10-CM | POA: Diagnosis not present

## 2017-11-29 NOTE — Anesthesia Preprocedure Evaluation (Addendum)
Anesthesia Evaluation  Patient identified by MRN, date of birth, ID band Patient awake    Reviewed: Allergy & Precautions, NPO status , Patient's Chart, lab work & pertinent test results  Airway Mallampati: I  TM Distance: >3 FB Neck ROM: Full    Dental no notable dental hx. (+) Teeth Intact, Dental Advisory Given   Pulmonary neg pulmonary ROS,    Pulmonary exam normal breath sounds clear to auscultation       Cardiovascular negative cardio ROS Normal cardiovascular exam Rhythm:Regular Rate:Normal     Neuro/Psych  Headaches, PSYCHIATRIC DISORDERS Anxiety    GI/Hepatic Neg liver ROS, GERD  ,  Endo/Other  negative endocrine ROS  Renal/GU negative Renal ROS  negative genitourinary   Musculoskeletal   Abdominal   Peds  Hematology negative hematology ROS (+)   Anesthesia Other Findings C6-7 radiculopathy  Reproductive/Obstetrics                           Anesthesia Physical Anesthesia Plan  ASA: II  Anesthesia Plan: General   Post-op Pain Management:    Induction: Intravenous  PONV Risk Score and Plan: 3 and Dexamethasone, Ondansetron and Midazolam  Airway Management Planned: Oral ETT  Additional Equipment:   Intra-op Plan:   Post-operative Plan: Extubation in OR  Informed Consent: I have reviewed the patients History and Physical, chart, labs and discussed the procedure including the risks, benefits and alternatives for the proposed anesthesia with the patient or authorized representative who has indicated his/her understanding and acceptance.   Dental advisory given  Plan Discussed with: CRNA  Anesthesia Plan Comments:        Anesthesia Quick Evaluation

## 2017-11-30 ENCOUNTER — Encounter (HOSPITAL_COMMUNITY): Payer: Self-pay | Admitting: Certified Registered Nurse Anesthetist

## 2017-11-30 ENCOUNTER — Ambulatory Visit (HOSPITAL_COMMUNITY): Payer: BLUE CROSS/BLUE SHIELD | Admitting: Physician Assistant

## 2017-11-30 ENCOUNTER — Ambulatory Visit (HOSPITAL_COMMUNITY)
Admission: RE | Disposition: A | Discharge status: Home / Self Care | Payer: Self-pay | Source: Ambulatory Visit | Attending: Orthopedic Surgery

## 2017-11-30 ENCOUNTER — Ambulatory Visit (HOSPITAL_COMMUNITY): Payer: BLUE CROSS/BLUE SHIELD | Admitting: Anesthesiology

## 2017-11-30 ENCOUNTER — Observation Stay (HOSPITAL_COMMUNITY)
Admission: RE | Admit: 2017-11-30 | Discharge: 2017-12-01 | Disposition: A | Discharge status: Home / Self Care | Payer: BLUE CROSS/BLUE SHIELD | Source: Ambulatory Visit | Attending: Orthopedic Surgery | Admitting: Orthopedic Surgery

## 2017-11-30 ENCOUNTER — Ambulatory Visit (HOSPITAL_COMMUNITY): Payer: BLUE CROSS/BLUE SHIELD

## 2017-11-30 DIAGNOSIS — Z981 Arthrodesis status: Secondary | ICD-10-CM | POA: Diagnosis not present

## 2017-11-30 DIAGNOSIS — M4802 Spinal stenosis, cervical region: Secondary | ICD-10-CM | POA: Diagnosis not present

## 2017-11-30 DIAGNOSIS — F4310 Post-traumatic stress disorder, unspecified: Secondary | ICD-10-CM | POA: Diagnosis not present

## 2017-11-30 DIAGNOSIS — M5412 Radiculopathy, cervical region: Secondary | ICD-10-CM | POA: Diagnosis not present

## 2017-11-30 DIAGNOSIS — M5020 Other cervical disc displacement, unspecified cervical region: Secondary | ICD-10-CM | POA: Diagnosis present

## 2017-11-30 DIAGNOSIS — Z87820 Personal history of traumatic brain injury: Secondary | ICD-10-CM | POA: Diagnosis not present

## 2017-11-30 DIAGNOSIS — Z419 Encounter for procedure for purposes other than remedying health state, unspecified: Secondary | ICD-10-CM

## 2017-11-30 DIAGNOSIS — M4722 Other spondylosis with radiculopathy, cervical region: Secondary | ICD-10-CM | POA: Diagnosis not present

## 2017-11-30 HISTORY — PX: ANTERIOR CERVICAL DECOMP/DISCECTOMY FUSION: SHX1161

## 2017-11-30 LAB — POCT PREGNANCY, URINE: Preg Test, Ur: NEGATIVE

## 2017-11-30 SURGERY — ANTERIOR CERVICAL DECOMPRESSION/DISCECTOMY FUSION 2 LEVELS
Anesthesia: General | Site: Spine Cervical

## 2017-11-30 MED ORDER — SCOPOLAMINE 1 MG/3DAYS TD PT72
1.0000 | MEDICATED_PATCH | TRANSDERMAL | Status: DC
  Administered 2017-11-30: 1 via TRANSDERMAL

## 2017-11-30 MED ORDER — ACETAMINOPHEN 10 MG/ML IV SOLN
INTRAVENOUS | Status: AC
  Filled 2017-11-30: qty 100

## 2017-11-30 MED ORDER — PHENYLEPHRINE 40 MCG/ML (10ML) SYRINGE FOR IV PUSH (FOR BLOOD PRESSURE SUPPORT)
PREFILLED_SYRINGE | INTRAVENOUS | Status: DC | PRN
  Administered 2017-11-30: 80 ug via INTRAVENOUS
  Administered 2017-11-30: 40 ug via INTRAVENOUS

## 2017-11-30 MED ORDER — HYDROMORPHONE HCL 1 MG/ML IJ SOLN
INTRAMUSCULAR | Status: AC
  Filled 2017-11-30: qty 1

## 2017-11-30 MED ORDER — METHOCARBAMOL 500 MG PO TABS
500.0000 mg | ORAL_TABLET | Freq: Four times a day (QID) | ORAL | Status: DC | PRN
  Administered 2017-11-30 – 2017-12-01 (×3): 500 mg via ORAL
  Filled 2017-11-30 (×2): qty 1

## 2017-11-30 MED ORDER — BUPIVACAINE-EPINEPHRINE 0.25% -1:200000 IJ SOLN
INTRAMUSCULAR | Status: DC | PRN
  Administered 2017-11-30: 30 mL

## 2017-11-30 MED ORDER — 0.9 % SODIUM CHLORIDE (POUR BTL) OPTIME
TOPICAL | Status: DC | PRN
  Administered 2017-11-30: 1000 mL

## 2017-11-30 MED ORDER — METHYLPHENIDATE HCL ER 18 MG PO TB24: 36.0000 mg | ORAL_TABLET | Freq: Every day | ORAL | Status: DC

## 2017-11-30 MED ORDER — EPHEDRINE 5 MG/ML INJ
INTRAVENOUS | Status: AC
  Filled 2017-11-30: qty 10

## 2017-11-30 MED ORDER — ONDANSETRON HCL 4 MG PO TABS: 4.0000 mg | ORAL_TABLET | Freq: Three times a day (TID) | ORAL | 0 refills | Status: DC | PRN

## 2017-11-30 MED ORDER — METHOCARBAMOL 1000 MG/10ML IJ SOLN
500.0000 mg | Freq: Four times a day (QID) | INTRAVENOUS | Status: DC | PRN
  Filled 2017-11-30: qty 5

## 2017-11-30 MED ORDER — ROCURONIUM BROMIDE 50 MG/5ML IV SOSY
PREFILLED_SYRINGE | INTRAVENOUS | Status: AC
  Filled 2017-11-30: qty 5

## 2017-11-30 MED ORDER — ONDANSETRON HCL 4 MG/2ML IJ SOLN
INTRAMUSCULAR | Status: DC | PRN
  Administered 2017-11-30: 4 mg via INTRAVENOUS

## 2017-11-30 MED ORDER — OXYCODONE HCL 5 MG PO TABS
10.0000 mg | ORAL_TABLET | ORAL | Status: DC | PRN
  Administered 2017-11-30 – 2017-12-01 (×6): 10 mg via ORAL
  Filled 2017-11-30 (×4): qty 2

## 2017-11-30 MED ORDER — PHENOL 1.4 % MT LIQD: 1.0000 | OROMUCOSAL | Status: DC | PRN

## 2017-11-30 MED ORDER — ROCURONIUM BROMIDE 10 MG/ML (PF) SYRINGE
PREFILLED_SYRINGE | INTRAVENOUS | Status: DC | PRN
  Administered 2017-11-30: 40 mg via INTRAVENOUS
  Administered 2017-11-30: 10 mg via INTRAVENOUS
  Administered 2017-11-30: 20 mg via INTRAVENOUS
  Administered 2017-11-30: 10 mg via INTRAVENOUS

## 2017-11-30 MED ORDER — SODIUM CHLORIDE 0.9% FLUSH: 3.0000 mL | INTRAVENOUS | Status: DC | PRN

## 2017-11-30 MED ORDER — SODIUM CHLORIDE 0.9 % IV SOLN
INTRAVENOUS | Status: DC | PRN
  Administered 2017-11-30: 10 ug/min via INTRAVENOUS

## 2017-11-30 MED ORDER — POLYETHYLENE GLYCOL 3350 17 G PO PACK: 17.0000 g | PACK | Freq: Every day | ORAL | Status: DC | PRN

## 2017-11-30 MED ORDER — SUGAMMADEX SODIUM 200 MG/2ML IV SOLN
INTRAVENOUS | Status: DC | PRN
  Administered 2017-11-30: 125 mg via INTRAVENOUS

## 2017-11-30 MED ORDER — BUPIVACAINE-EPINEPHRINE (PF) 0.25% -1:200000 IJ SOLN
INTRAMUSCULAR | Status: AC
  Filled 2017-11-30: qty 30

## 2017-11-30 MED ORDER — MORPHINE SULFATE (PF) 2 MG/ML IV SOLN
2.0000 mg | INTRAVENOUS | Status: DC | PRN
  Administered 2017-11-30: 2 mg via INTRAVENOUS
  Filled 2017-11-30: qty 1

## 2017-11-30 MED ORDER — PHENYLEPHRINE 40 MCG/ML (10ML) SYRINGE FOR IV PUSH (FOR BLOOD PRESSURE SUPPORT)
PREFILLED_SYRINGE | INTRAVENOUS | Status: AC
  Filled 2017-11-30: qty 10

## 2017-11-30 MED ORDER — ONDANSETRON HCL 4 MG/2ML IJ SOLN: 4.0000 mg | Freq: Four times a day (QID) | INTRAMUSCULAR | Status: DC | PRN

## 2017-11-30 MED ORDER — SODIUM CHLORIDE 0.9 % IV SOLN: 250.0000 mL | INTRAVENOUS | Status: DC

## 2017-11-30 MED ORDER — LACTATED RINGERS IV SOLN: INTRAVENOUS | Status: DC

## 2017-11-30 MED ORDER — SCOPOLAMINE 1 MG/3DAYS TD PT72
MEDICATED_PATCH | TRANSDERMAL | Status: AC
  Filled 2017-11-30: qty 1

## 2017-11-30 MED ORDER — MIDAZOLAM HCL 5 MG/5ML IJ SOLN
INTRAMUSCULAR | Status: DC | PRN
  Administered 2017-11-30: 2 mg via INTRAVENOUS

## 2017-11-30 MED ORDER — SODIUM CHLORIDE 0.9% FLUSH
3.0000 mL | Freq: Two times a day (BID) | INTRAVENOUS | Status: DC
  Administered 2017-11-30: 3 mL via INTRAVENOUS

## 2017-11-30 MED ORDER — ACETAMINOPHEN 10 MG/ML IV SOLN
INTRAVENOUS | Status: DC | PRN
  Administered 2017-11-30: 1000 mg via INTRAVENOUS

## 2017-11-30 MED ORDER — OXYCODONE HCL 5 MG PO TABS
5.0000 mg | ORAL_TABLET | ORAL | Status: DC | PRN
  Filled 2017-11-30 (×2): qty 1

## 2017-11-30 MED ORDER — PROPOFOL 10 MG/ML IV BOLUS
INTRAVENOUS | Status: AC
  Filled 2017-11-30: qty 20

## 2017-11-30 MED ORDER — DEXAMETHASONE SODIUM PHOSPHATE 10 MG/ML IJ SOLN
INTRAMUSCULAR | Status: DC | PRN
  Administered 2017-11-30: 10 mg via INTRAVENOUS

## 2017-11-30 MED ORDER — ONDANSETRON HCL 4 MG PO TABS: 4.0000 mg | ORAL_TABLET | Freq: Four times a day (QID) | ORAL | Status: DC | PRN

## 2017-11-30 MED ORDER — METHOCARBAMOL 500 MG PO TABS: 500.0000 mg | ORAL_TABLET | Freq: Three times a day (TID) | ORAL | 0 refills | Status: AC | PRN

## 2017-11-30 MED ORDER — CEFAZOLIN SODIUM-DEXTROSE 2-4 GM/100ML-% IV SOLN
2.0000 g | Freq: Three times a day (TID) | INTRAVENOUS | Status: AC
  Administered 2017-11-30 (×2): 2 g via INTRAVENOUS
  Filled 2017-11-30 (×2): qty 100

## 2017-11-30 MED ORDER — LACTATED RINGERS IV SOLN
INTRAVENOUS | Status: DC | PRN
  Administered 2017-11-30 (×2): via INTRAVENOUS

## 2017-11-30 MED ORDER — ACETAMINOPHEN 325 MG PO TABS: 650.0000 mg | ORAL_TABLET | ORAL | Status: DC | PRN

## 2017-11-30 MED ORDER — OXYCODONE HCL 5 MG PO TABS
ORAL_TABLET | ORAL | Status: AC
  Filled 2017-11-30: qty 2

## 2017-11-30 MED ORDER — ACETAMINOPHEN 650 MG RE SUPP: 650.0000 mg | RECTAL | Status: DC | PRN

## 2017-11-30 MED ORDER — METHOCARBAMOL 500 MG PO TABS
ORAL_TABLET | ORAL | Status: AC
  Filled 2017-11-30: qty 1

## 2017-11-30 MED ORDER — FENTANYL CITRATE (PF) 250 MCG/5ML IJ SOLN
INTRAMUSCULAR | Status: AC
  Filled 2017-11-30: qty 5

## 2017-11-30 MED ORDER — THROMBIN (RECOMBINANT) 20000 UNITS EX SOLR
CUTANEOUS | Status: AC
  Filled 2017-11-30: qty 20000

## 2017-11-30 MED ORDER — ONDANSETRON HCL 4 MG/2ML IJ SOLN
INTRAMUSCULAR | Status: AC
  Filled 2017-11-30: qty 2

## 2017-11-30 MED ORDER — THROMBIN 20000 UNITS EX SOLR
CUTANEOUS | Status: DC | PRN
  Administered 2017-11-30: 20 mL via TOPICAL

## 2017-11-30 MED ORDER — LIDOCAINE 2% (20 MG/ML) 5 ML SYRINGE
INTRAMUSCULAR | Status: DC | PRN
  Administered 2017-11-30: 60 mg via INTRAVENOUS

## 2017-11-30 MED ORDER — BUSPIRONE HCL 5 MG PO TABS
5.0000 mg | ORAL_TABLET | Freq: Two times a day (BID) | ORAL | Status: DC | PRN
  Filled 2017-11-30: qty 1.5

## 2017-11-30 MED ORDER — LIDOCAINE 2% (20 MG/ML) 5 ML SYRINGE
INTRAMUSCULAR | Status: AC
  Filled 2017-11-30: qty 5

## 2017-11-30 MED ORDER — DEXAMETHASONE 4 MG PO TABS
4.0000 mg | ORAL_TABLET | Freq: Four times a day (QID) | ORAL | Status: DC
  Administered 2017-11-30 – 2017-12-01 (×3): 4 mg via ORAL
  Filled 2017-11-30 (×3): qty 1

## 2017-11-30 MED ORDER — FENTANYL CITRATE (PF) 250 MCG/5ML IJ SOLN
INTRAMUSCULAR | Status: DC | PRN
  Administered 2017-11-30: 100 ug via INTRAVENOUS
  Administered 2017-11-30 (×2): 50 ug via INTRAVENOUS

## 2017-11-30 MED ORDER — DEXAMETHASONE SODIUM PHOSPHATE 10 MG/ML IJ SOLN
INTRAMUSCULAR | Status: AC
  Filled 2017-11-30: qty 1

## 2017-11-30 MED ORDER — MIDAZOLAM HCL 2 MG/2ML IJ SOLN
INTRAMUSCULAR | Status: AC
  Filled 2017-11-30: qty 2

## 2017-11-30 MED ORDER — PROPOFOL 10 MG/ML IV BOLUS
INTRAVENOUS | Status: DC | PRN
  Administered 2017-11-30: 100 mg via INTRAVENOUS

## 2017-11-30 MED ORDER — DEXAMETHASONE SODIUM PHOSPHATE 4 MG/ML IJ SOLN: 4.0000 mg | Freq: Four times a day (QID) | INTRAMUSCULAR | Status: DC

## 2017-11-30 MED ORDER — OXYCODONE-ACETAMINOPHEN 10-325 MG PO TABS: 1.0000 | ORAL_TABLET | ORAL | 0 refills | Status: DC | PRN

## 2017-11-30 MED ORDER — HEMOSTATIC AGENTS (NO CHARGE) OPTIME
TOPICAL | Status: DC | PRN
  Administered 2017-11-30: 1 via TOPICAL

## 2017-11-30 MED ORDER — CEFAZOLIN SODIUM-DEXTROSE 2-4 GM/100ML-% IV SOLN
2.0000 g | INTRAVENOUS | Status: AC
  Administered 2017-11-30: 2 g via INTRAVENOUS

## 2017-11-30 MED ORDER — MENTHOL 3 MG MT LOZG
1.0000 | LOZENGE | OROMUCOSAL | Status: DC | PRN
  Filled 2017-11-30: qty 9

## 2017-11-30 MED ORDER — HYDROMORPHONE HCL 1 MG/ML IJ SOLN
0.2500 mg | INTRAMUSCULAR | Status: DC | PRN
  Administered 2017-11-30 (×4): 0.5 mg via INTRAVENOUS

## 2017-11-30 MED ORDER — EPHEDRINE SULFATE-NACL 50-0.9 MG/10ML-% IV SOSY
PREFILLED_SYRINGE | INTRAVENOUS | Status: DC | PRN
  Administered 2017-11-30: 5 mg via INTRAVENOUS

## 2017-11-30 SURGICAL SUPPLY — 72 items
BIT DRILL SKYLINE 12MM (BIT) ×1 IMPLANT
BLADE CLIPPER SURG (BLADE) IMPLANT
BONE VIVIGEN FORMABLE 1.3CC (Bone Implant) ×2 IMPLANT
BUR EGG ELITE 4.0 (BURR) IMPLANT
BUR MATCHSTICK NEURO 3.0 LAGG (BURR) IMPLANT
CABLE BIPOLOR RESECTION CORD (MISCELLANEOUS) ×2 IMPLANT
CAGE LORDOTIC 6 SM (Cage) ×2 IMPLANT
CANISTER SUCT 3000ML PPV (MISCELLANEOUS) ×2 IMPLANT
CLSR STERI-STRIP ANTIMIC 1/2X4 (GAUZE/BANDAGES/DRESSINGS) ×2 IMPLANT
COVER MAYO STAND STRL (DRAPES) ×6 IMPLANT
COVER SURGICAL LIGHT HANDLE (MISCELLANEOUS) ×4 IMPLANT
COVER WAND RF STERILE (DRAPES) ×2 IMPLANT
CRADLE DONUT ADULT HEAD (MISCELLANEOUS) ×2 IMPLANT
DEVICE ENDSKLTN IMPLANT SM 7MM (Cage) ×1 IMPLANT
DRAPE C-ARM 42X72 X-RAY (DRAPES) ×2 IMPLANT
DRAPE MICROSCOPE LEICA 46X105 (MISCELLANEOUS) IMPLANT
DRAPE POUCH INSTRU U-SHP 10X18 (DRAPES) ×2 IMPLANT
DRAPE SURG 17X23 STRL (DRAPES) ×2 IMPLANT
DRAPE U-SHAPE 47X51 STRL (DRAPES) ×2 IMPLANT
DRILL BIT SKYLINE 12MM (BIT) ×1
DRSG OPSITE POSTOP 3X4 (GAUZE/BANDAGES/DRESSINGS) ×2 IMPLANT
DRSG OPSITE POSTOP 4X6 (GAUZE/BANDAGES/DRESSINGS) ×2 IMPLANT
DURAPREP 26ML APPLICATOR (WOUND CARE) ×2 IMPLANT
ELECT COATED BLADE 2.86 ST (ELECTRODE) ×2 IMPLANT
ELECT PENCIL ROCKER SW 15FT (MISCELLANEOUS) ×2 IMPLANT
ELECT REM PT RETURN 9FT ADLT (ELECTROSURGICAL) ×2
ELECTRODE REM PT RTRN 9FT ADLT (ELECTROSURGICAL) ×1 IMPLANT
ENDOSKELETON IMPLANT SM 7MM (Cage) ×2 IMPLANT
FLOSEAL 10ML (HEMOSTASIS) IMPLANT
GLOVE BIO SURGEON STRL SZ 6.5 (GLOVE) ×2 IMPLANT
GLOVE BIOGEL PI IND STRL 6.5 (GLOVE) ×1 IMPLANT
GLOVE BIOGEL PI IND STRL 8.5 (GLOVE) ×1 IMPLANT
GLOVE BIOGEL PI INDICATOR 6.5 (GLOVE) ×1
GLOVE BIOGEL PI INDICATOR 8.5 (GLOVE) ×1
GLOVE SS BIOGEL STRL SZ 8.5 (GLOVE) ×1 IMPLANT
GLOVE SUPERSENSE BIOGEL SZ 8.5 (GLOVE) ×1
GOWN STRL REUS W/ TWL LRG LVL3 (GOWN DISPOSABLE) ×1 IMPLANT
GOWN STRL REUS W/TWL 2XL LVL3 (GOWN DISPOSABLE) ×2 IMPLANT
GOWN STRL REUS W/TWL LRG LVL3 (GOWN DISPOSABLE) ×1
KIT BASIN OR (CUSTOM PROCEDURE TRAY) ×2 IMPLANT
KIT TURNOVER KIT B (KITS) ×2 IMPLANT
NEEDLE HYPO 22GX1.5 SAFETY (NEEDLE) ×2 IMPLANT
NEEDLE SPNL 18GX3.5 QUINCKE PK (NEEDLE) ×2 IMPLANT
NS IRRIG 1000ML POUR BTL (IV SOLUTION) ×6 IMPLANT
PACK ORTHO CERVICAL (CUSTOM PROCEDURE TRAY) ×2 IMPLANT
PACK UNIVERSAL I (CUSTOM PROCEDURE TRAY) ×2 IMPLANT
PAD ARMBOARD 7.5X6 YLW CONV (MISCELLANEOUS) ×4 IMPLANT
PATTIES SURGICAL .25X.25 (GAUZE/BANDAGES/DRESSINGS) ×2 IMPLANT
PIN DISTRACTION 14 (PIN) ×4 IMPLANT
PLATE TWO LEVEL SKYLINE 30MM (Plate) ×2 IMPLANT
RESTRAINT LIMB HOLDER UNIV (RESTRAINTS) ×2 IMPLANT
RUBBERBAND STERILE (MISCELLANEOUS) IMPLANT
SCREW SELF DRILL SKYLINE 12MM (Screw) ×4 IMPLANT
SCREW SKYLINE 14MM SD-VA (Screw) ×8 IMPLANT
SPONGE INTESTINAL PEANUT (DISPOSABLE) ×2 IMPLANT
SPONGE LAP 4X18 RFD (DISPOSABLE) ×4 IMPLANT
SPONGE SURGIFOAM ABS GEL 100 (HEMOSTASIS) ×2 IMPLANT
SURGIFLO TRUKIT (HEMOSTASIS) ×2 IMPLANT
SUT BONE WAX W31G (SUTURE) ×2 IMPLANT
SUT MON AB 3-0 SH 27 (SUTURE) ×1
SUT MON AB 3-0 SH27 (SUTURE) ×1 IMPLANT
SUT SILK 2 0 (SUTURE)
SUT SILK 2-0 18XBRD TIE 12 (SUTURE) IMPLANT
SUT VIC AB 2-0 CT1 18 (SUTURE) ×2 IMPLANT
SYR BULB IRRIGATION 50ML (SYRINGE) ×2 IMPLANT
SYR CONTROL 10ML LL (SYRINGE) ×2 IMPLANT
TAPE CLOTH 4X10 WHT NS (GAUZE/BANDAGES/DRESSINGS) ×2 IMPLANT
TAPE UMBILICAL COTTON 1/8X30 (MISCELLANEOUS) ×2 IMPLANT
TOWEL GREEN STERILE (TOWEL DISPOSABLE) ×2 IMPLANT
TOWEL GREEN STERILE FF (TOWEL DISPOSABLE) ×2 IMPLANT
WATER STERILE IRR 1000ML POUR (IV SOLUTION) IMPLANT
YANKAUER SUCT BULB TIP NO VENT (SUCTIONS) ×2 IMPLANT

## 2017-11-30 NOTE — Anesthesia Procedure Notes (Signed)
Procedure Name: Intubation Date/Time: 11/30/2017 7:43 AM Performed by: Colin Benton, CRNA Pre-anesthesia Checklist: Patient identified, Emergency Drugs available, Suction available and Patient being monitored Patient Re-evaluated:Patient Re-evaluated prior to induction Oxygen Delivery Method: Circle System Utilized Preoxygenation: Pre-oxygenation with 100% oxygen Induction Type: IV induction Ventilation: Mask ventilation without difficulty Laryngoscope Size: Mac and 3 Grade View: Grade I Tube type: Oral Tube size: 7.0 mm Number of attempts: 1 Airway Equipment and Method: Stylet Placement Confirmation: ETT inserted through vocal cords under direct vision,  positive ETCO2 and breath sounds checked- equal and bilateral Secured at: 24 cm Tube secured with: Tape Dental Injury: Teeth and Oropharynx as per pre-operative assessment

## 2017-11-30 NOTE — Discharge Instructions (Signed)

## 2017-11-30 NOTE — Op Note (Signed)
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  C6-7: 6 mm small lordotic.  C5-6: 7 mm lordotic small.  Depey anterior skyline plate: 30 mm length.  Allograft:vivogen  Indications: This is a very pleasant 37 year old woman whose had 10+ years of significant neck and progressive radicular arm pain.  Imaging studies demonstrated cervical foraminal stenosis and hard disc osteophyte at C5-6, and C6-7.  There was subsequent irritation to the C6 and C7 nerve root.  Unfortunately conservative management failed to alleviate her symptoms and so we elected to move forward with surgery.  All appropriate risks benefits and alternatives were discussed with the patient and consent was obtained.  Operative report patient was brought the operating room placed upon the operating room table.  After successful induction of general anesthesia and endotracheally patient teds SCDs were applied and the anterior cervical spine was prepped and draped in a standard fashion.  A timeout was taken to confirm patient procedure and all other important data.  Lateral fluoroscopy was used to identify the C6 vertebral body and the incision site was marked out.  This was then infiltrated with quarter percent Marcaine.  Transverse incision was made and sharp dissection was carried out down to the platysma.  The platysma was sharply incised and I proceeded utilizing a standard Smith-Robinson approach to the anterior cervical spine.  I sharply dissected along the medial border of the sternocleidomastoid sweeping the esophagus and trachea off to the right.  I then was able to bluntly dissect through the manner of the vertebral fascia to expose the anterior longitudinal ligament.  A needle was placed into the C5-6 disc space and x-ray was taken to confirm I was at the  appropriate level.  Once confirmed I then mobilized the longus coli muscle using bipolar electrocautery from the superior aspect of C5 down to the inferior aspect of C7.  I then placed the Caspar retracting blades underneath the longus coli muscle deflated the endotracheal cuff and expanded the retractor to the appropriate width.  The endotracheal cuff was then reinflated.  The C6-7 annulus was incised and I used pituitary Rogers to remove the bulk of the disc material.  I then trimmed down the anterior osteophyte from the inferior edge of C6 and I placed my distraction pins into the body of C6 and C7.  I distracted the intervertebral space and then maintain that with the distraction pin set  I continued using my curettes to resect all of the disc material.  I also remove the cartilaginous endplate.  I then used my 1 mm Kerrison Roger to develop a plane underneath the posterior aspect of the vertebral bodies and I resected the posterior osteophyte.  This allowed me get under the uncovertebral joint and remove the osteophyte.  I used a neural hook to develop a plane underneath the annulus and resected the posterior annulus completely releasing it from the vertebral body.  At this point I had excellent parallel endplate distraction and compression.  The uncovertebral joints were decompressed and I could freely pass my nerve under the uncovertebral joint and out the foramen and underneath the posterior aspect of the vertebral bodies.  At this point I used a rasp trial and elected to use a size 6 small lordotic cage.  The cage was obtained and packed with the allograft and then malleted to the appropriate depth.  Preoperatively the patient had  loss of normal sagittal alignment but throughout the case I was able to re-create her normal cervical lordosis and maintain that.  Once the 6 7 cage was in place I then repositioned the retractors to expose the C5-6 disc space.  Using the same technique that I utilized at the 6  7 level I performed a complete discectomy at C6-7.  A nerve hook was used to develop a plane under the posterior annulus and posterior longitudinal ligament and these were resected.  Great care was taken on the right side as this is where the foraminal stenosis was more marked.  I remove the osteophyte and again at the conclusion of the decompression I was able to freely pass my nerve hook under the vertebral body and out the neuroforamen.  I then used my rasp trials and elected to use a size 7 small lordotic cage.  The cage was obtained packed with allograft and malleted to the appropriate depth.  I had excellent fixation at both levels.  Also had restored the normal cervical lordosis.  The anterior cervical plate was obtained and contoured and then secured to the C5 and C7 vertebral bodies with 14 m locking screws and 12 mm screws into the body of C6.  All screws had excellent purchase.  The locking ring was then secured according manufacture standards.  I irrigated the wound copiously normal saline and checked to ensure hemostasis.  I also confirmed that the esophagus was not entrapped beneath the plate.  Once I had hemostasis and irrigation complete I then returned trachea and esophagus to midline and closed the platysma with interrupted 2-0 Vicryl sutures.  Skin was closed with 3-0 Monocryl.  Steri-Strips and dry dressings were applied.  The patient was ultimately extubated transfer the PACU without incident.  The end of the case all needle sponge counts were correct.

## 2017-11-30 NOTE — Evaluation (Signed)
Physical Therapy Evaluation Patient Details Name: Tina Haney MRN: [id number] DOB: Aug 14, 1980 Today's Date: 11/30/2017   History of Present Illness  Patient is a 37 y/o admitted due to cervical spondylosis and radicular symptoms now s/p ACDF C5-7.  Clinical Impression  Patient presents with mobility close to baseline, but educated with spinal precautions including sleeping positions and performing daily activities differently.  No further skilled PT needs at this time.  Encouraged ambulation with staff or family assist.     Follow Up Recommendations No PT follow up    Equipment Recommendations  None recommended by PT    Recommendations for Other Services       Precautions / Restrictions Precautions Precautions: Fall;Cervical Precaution Booklet Issued: Yes (comment) Required Braces or Orthoses: Cervical Brace Cervical Brace: Hard collar      Mobility  Bed Mobility Overal bed mobility: Needs Assistance Bed Mobility: Rolling;Sidelying to Sit;Sit to Sidelying Rolling: Supervision Sidelying to sit: Supervision     Sit to sidelying: Supervision General bed mobility comments: cues for technique and assist for log roll  Transfers Overall transfer level: Needs assistance Equipment used: None Transfers: Sit to/from Stand Sit to Stand: Supervision            Ambulation/Gait Ambulation/Gait assistance: Supervision;Min guard Gait Distance (Feet): 200 Feet Assistive device: None Gait Pattern/deviations: Step-through pattern;Decreased stride length     General Gait Details: slower pace, mild unsteadiness, but no hand hold support given  Stairs Stairs: Yes   Stair Management: One rail Right;Forwards;Step to pattern Number of Stairs: 4 General stair comments: cues for step to sequence  Wheelchair Mobility    Modified Rankin (Stroke Patients Only)       Balance Overall balance assessment: Mild deficits observed, not formally tested                                           Pertinent Vitals/Pain Pain Assessment: Faces Faces Pain Scale: Hurts little more Pain Location: neck Pain Descriptors / Indicators: Sore Pain Intervention(s): Repositioned;Monitored during session    Home Living Family/patient expects to be discharged to:: Private residence Living Arrangements: Spouse/significant other Available Help at Discharge: Family Type of Home: House Home Access: Stairs to enter Entrance Stairs-Rails: Doctor, general practice of Steps: 4 Home Layout: One level Home Equipment: None      Prior Function Level of Independence: Independent               Hand Dominance   Dominant Hand: Right    Extremity/Trunk Assessment   Upper Extremity Assessment Upper Extremity Assessment: Generalized weakness    Lower Extremity Assessment Lower Extremity Assessment: Overall WFL for tasks assessed       Communication   Communication: No difficulties  Cognition Arousal/Alertness: Awake/alert Behavior During Therapy: WFL for tasks assessed/performed Overall Cognitive Status: Within Functional Limits for tasks assessed                                        General Comments      Exercises     Assessment/Plan    PT Assessment Patent does not need any further PT services  PT Problem List         PT Treatment Interventions      PT Goals (Current goals can be found in the Care Plan  section)  Acute Rehab PT Goals PT Goal Formulation: All assessment and education complete, DC therapy    Frequency     Barriers to discharge        Co-evaluation               AM-PAC PT "6 Clicks" Daily Activity  Outcome Measure Difficulty turning over in bed (including adjusting bedclothes, sheets and blankets)?: A Little Difficulty moving from lying on back to sitting on the side of the bed? : A Little Difficulty sitting down on and standing up from a chair with arms (e.g., wheelchair,  bedside commode, etc,.)?: A Little Help needed moving to and from a bed to chair (including a wheelchair)?: None Help needed walking in hospital room?: A Little Help needed climbing 3-5 steps with a railing? : A Little 6 Click Score: 19    End of Session Equipment Utilized During Treatment: Gait belt;Cervical collar Activity Tolerance: Patient tolerated treatment well Patient left: with call bell/phone within reach;with family/visitor present;in bed   PT Visit Diagnosis: Other abnormalities of gait and mobility (R26.89)    Time: 1610-9604 PT Time Calculation (min) (ACUTE ONLY): 21 min   Charges:   PT Evaluation $PT Eval Low Complexity: 1 Low          Tina Haney, PT Acute Rehabilitation Services [phone] 11/30/2017   Tina Haney 11/30/2017, 5:02 PM

## 2017-11-30 NOTE — Brief Op Note (Signed)
11/30/2017  11:07 AM  PATIENT:  [facility]  37 y.o. 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, cervical spondylotic radiculopathy  PROCEDURE:  Procedure(s) with comments: ANTERIOR CERVICAL DECOMPRESSION/DISCECTOMY FUSION CERVICAL 5-7 (N/A) - 3.5 hrs  SURGEON:  Surgeon(s) and Role:    Venita Lick, MD - Primary  PHYSICIAN ASSISTANT:   ASSISTANTS: none   ANESTHESIA:   general  EBL:  25 mL   BLOOD ADMINISTERED:none  DRAINS: none   LOCAL MEDICATIONS USED:  MARCAINE     SPECIMEN:  No Specimen  DISPOSITION OF SPECIMEN:  N/A  COUNTS:  YES  TOURNIQUET:  * No tourniquets in log *  DICTATION: .Dragon Dictation  PLAN OF CARE: Admit for overnight observation  PATIENT DISPOSITION:  PACU - hemodynamically stable.

## 2017-11-30 NOTE — Anesthesia Postprocedure Evaluation (Signed)
Anesthesia Post Note  Patient: [facility]  Procedure(s) Performed: ANTERIOR CERVICAL DECOMPRESSION/DISCECTOMY FUSION CERVICAL 5-7 (N/A Spine Cervical)     Patient location during evaluation: PACU Anesthesia Type: General Level of consciousness: awake and alert Pain management: pain level controlled Vital Signs Assessment: post-procedure vital signs reviewed and stable Respiratory status: spontaneous breathing, nonlabored ventilation, respiratory function stable and patient connected to nasal cannula oxygen Cardiovascular status: blood pressure returned to baseline and stable Postop Assessment: no apparent nausea or vomiting Anesthetic complications: no    Last Vitals:  Vitals:   11/30/17 1237 11/30/17 1411  BP: 121/71 118/83  Pulse: 82 86  Resp: 18   Temp: 36.7 C 36.6 C  SpO2: 99% 99%    Last Pain:  Vitals:   11/30/17 1411  TempSrc: Oral  PainSc:                  Chelsey L Woodrum

## 2017-11-30 NOTE — Transfer of Care (Signed)
Immediate Anesthesia Transfer of Care Note  Patient: [facility]  Procedure(s) Performed: ANTERIOR CERVICAL DECOMPRESSION/DISCECTOMY FUSION CERVICAL 5-7 (N/A Spine Cervical)  Patient Location: PACU  Anesthesia Type:General  Level of Consciousness: awake and patient cooperative  Airway & Oxygen Therapy: Patient Spontanous Breathing and Patient connected to face mask oxygen  Post-op Assessment: Report given to RN, Post -op Vital signs reviewed and stable and Patient moving all extremities X 4  Post vital signs: Reviewed and stable  Last Vitals:  Vitals Value Taken Time  BP 120/84 11/30/2017 11:03 AM  Temp    Pulse 93 11/30/2017 11:07 AM  Resp 13 11/30/2017 11:07 AM  SpO2 100 % 11/30/2017 11:07 AM  Vitals shown include unvalidated device data.  Last Pain:  Vitals:   11/30/17 0639  TempSrc: Oral         Complications: No apparent anesthesia complications

## 2017-12-01 ENCOUNTER — Encounter (HOSPITAL_COMMUNITY): Payer: Self-pay | Admitting: Orthopedic Surgery

## 2017-12-01 DIAGNOSIS — Z87820 Personal history of traumatic brain injury: Secondary | ICD-10-CM | POA: Diagnosis not present

## 2017-12-01 DIAGNOSIS — M4722 Other spondylosis with radiculopathy, cervical region: Secondary | ICD-10-CM | POA: Diagnosis not present

## 2017-12-01 DIAGNOSIS — F4310 Post-traumatic stress disorder, unspecified: Secondary | ICD-10-CM | POA: Diagnosis not present

## 2017-12-01 DIAGNOSIS — M4802 Spinal stenosis, cervical region: Secondary | ICD-10-CM | POA: Diagnosis not present

## 2017-12-01 MED FILL — Thrombin (Recombinant) For Soln 20000 Unit: CUTANEOUS | Qty: 1 | Status: AC

## 2017-12-01 NOTE — Evaluation (Signed)
Occupational Therapy Evaluation and Discharge Patient Details Name: Tina Haney MRN: [id number] DOB: 1980/10/12 Today's Date: 12/01/2017    History of Present Illness Patient is a 37 y/o admitted due to cervical spondylosis and radicular symptoms now s/p ACDF C5-7.   Clinical Impression   Pt is functioning modified independently in ADL and mobilizing independently. Educated at length in ADL and IADL adhering to cervical precautions. Pt and husband verbalizing understanding. No further OT needs.    Follow Up Recommendations  No OT follow up    Equipment Recommendations  None recommended by OT    Recommendations for Other Services       Precautions / Restrictions Precautions Precautions: Fall;Cervical Precaution Booklet Issued: Yes (comment) Precaution Comments: reviewed cervical precautions related to ADL and IADL Required Braces or Orthoses: Cervical Brace Cervical Brace: Hard collar Restrictions Weight Bearing Restrictions: No      Mobility Bed Mobility Overal bed mobility: Modified Independent             General bed mobility comments: reinfoced log roll technique  Transfers Overall transfer level: Independent Equipment used: None                  Balance                                           ADL either performed or assessed with clinical judgement   ADL Overall ADL's : Modified independent                                             Vision Baseline Vision/History: Wears glasses Wears Glasses: At all times Patient Visual Report: No change from baseline       Perception     Praxis      Pertinent Vitals/Pain Pain Assessment: Faces Faces Pain Scale: Hurts little more Pain Location: neck Pain Descriptors / Indicators: Sore Pain Intervention(s): Monitored during session     Hand Dominance Right   Extremity/Trunk Assessment Upper Extremity Assessment Upper Extremity Assessment: Overall WFL  for tasks assessed   Lower Extremity Assessment Lower Extremity Assessment: Overall WFL for tasks assessed       Communication Communication Communication: No difficulties   Cognition Arousal/Alertness: Awake/alert Behavior During Therapy: WFL for tasks assessed/performed Overall Cognitive Status: Within Functional Limits for tasks assessed                                     General Comments       Exercises     Shoulder Instructions      Home Living Family/patient expects to be discharged to:: Private residence Living Arrangements: Spouse/significant other;Children(4 children 12 to 7 years) Available Help at Discharge: Family;Friend(s);Available 24 hours/day Type of Home: House Home Access: Stairs to enter [employer] of Steps: 4 Entrance Stairs-Rails: Right;Left Home Layout: One level     Bathroom Shower/Tub: Tub only;Walk-in shower   Bathroom Toilet: Standard     Home Equipment: None          Prior Functioning/Environment Level of Independence: Independent                 OT Problem List:  OT Treatment/Interventions:      OT Goals(Current goals can be found in the care plan section)    OT Frequency:     Barriers to D/C:            Co-evaluation              AM-PAC PT "6 Clicks" Daily Activity     Outcome Measure Help from another person eating meals?: None Help from another person taking care of personal grooming?: None Help from another person toileting, which includes using toliet, bedpan, or urinal?: None Help from another person bathing (including washing, rinsing, drying)?: None Help from another person to put on and taking off regular upper body clothing?: None Help from another person to put on and taking off regular lower body clothing?: None 6 Click Score: 24   End of Session Equipment Utilized During Treatment: Cervical collar Nurse Communication: Other (comment)(ready for  d/c)  Activity Tolerance: Patient tolerated treatment well Patient left: in bed;with call bell/phone within reach;with family/visitor present  OT Visit Diagnosis: Pain                Time: 1610-9604 OT Time Calculation (min): 19 min Charges:  OT General Charges $OT Visit: 1 Visit OT Evaluation $OT Eval Low Complexity: 1 Low  Martie Round, OTR/L Acute Rehabilitation Services Pager: [phone] Office: [phone]  Tina Haney 12/01/2017, 8:48 AM

## 2017-12-01 NOTE — Progress Notes (Signed)
Pt discharged home.  Discharge instructions given to patient.  Receive pain medication prior to discharge.  Volunteer service took pt down via wheelchair.  VSS.

## 2017-12-01 NOTE — Progress Notes (Signed)
    Subjective: Procedure(s) (LRB): ANTERIOR CERVICAL DECOMPRESSION/DISCECTOMY FUSION CERVICAL 5-7 (N/A) 1 Day Post-Op  Patient reports pain as 2 on 0-10 scale.  Reports decreased arm pain reports incisional neck pain   Positive void Negative bowel movement Positive flatus Negative chest pain or shortness of breath  Objective: Vital signs in last 24 hours: Temp:  [97.2 F (36.2 C)-99.3 F (37.4 C)] 98.4 F (36.9 C) (11/01 0729) Pulse Rate:  [66-95] 69 (11/01 0729) Resp:  [8-18] 16 (11/01 0729) BP: (100-125)/(64-84) 100/64 (11/01 0729) SpO2:  [97 %-100 %] 99 % (11/01 0729)  Intake/Output from previous day: 10/31 0701 - 11/01 0700 In: 1840 [P.O.:440; I.V.:1200; IV Piggyback:200] Out: 26 [Urine:1; Blood:25]  Labs: No results for input(s): WBC, RBC, HCT, PLT in the last 72 hours. No results for input(s): NA, K, CL, CO2, BUN, CREATININE, GLUCOSE, CALCIUM in the last 72 hours. No results for input(s): LABPT, INR in the last 72 hours.  Physical Exam: Neurologically intact ABD soft Intact pulses distally Incision: dressing C/D/I and no drainage Compartment soft There is no height or weight on file to calculate BMI.  Assessment/Plan: Patient stable  xrays n/a Mobilization with physical therapy Encourage incentive spirometry Continue care  Advance diet Up with therapy  doing well overall Plan on d/c to home today - f/u in 2 weeks  Venita Lick, MD [facility] [phone]

## 2017-12-13 NOTE — Discharge Summary (Signed)
Patient ID: Tina Haney MRN: [id number] DOB/AGE: 08-04-1980 36 y.o.  Admit date: 11/30/2017 Discharge date: 12/13/2017  Admission Diagnoses:  Active Problems:   Cervical disc herniation   Discharge Diagnoses:  Active Problems:   Cervical disc herniation  status post Procedure(s): ANTERIOR CERVICAL DECOMPRESSION/DISCECTOMY FUSION CERVICAL 5-7  Past Medical History:  Diagnosis Date  . Acute Epstein Barr virus (EBV) infection   . Anemia   . Arthritis   . Chronic posttraumatic stress syndrome   . Concussion    09/2015,  Was 2nd or 3rd concussion.   . Esophagitis   . GERD (gastroesophageal reflux disease)   . Headache   . Herniated disc, cervical    C5/6 and 6/7   . Murmur, heart    as a child   . Polyp of colon   . PTSD (post-traumatic stress disorder)   . Thyroid nodule     Surgeries: Procedure(s): ANTERIOR CERVICAL DECOMPRESSION/DISCECTOMY FUSION CERVICAL 5-7 on 11/30/2017   Consultants:   Discharged Condition: Improved  Hospital Course: Tina Haney is an 37 y.o. female who was admitted 11/30/2017 for operative treatment of cervical degenerative disc disease with radiculopathy.. Patient failed conservative treatments (please see the history and physical for the specifics) and had severe unremitting pain that affects sleep, daily activities and work/hobbies. After pre-op clearance, the patient was taken to the operating room on 11/30/2017 and underwent  Procedure(s): ANTERIOR CERVICAL DECOMPRESSION/DISCECTOMY FUSION CERVICAL 5-7.    Patient was given perioperative antibiotics:  Anti-infectives (From admission, onward)   Start     Dose/Rate Route Frequency Ordered Stop   11/30/17 1600  ceFAZolin (ANCEF) IVPB 2g/100 mL premix     2 g 200 mL/hr over 30 Minutes Intravenous Every 8 hours 11/30/17 1244 11/30/17 2340   11/30/17 0623  ceFAZolin (ANCEF) IVPB 2g/100 mL premix     2 g 200 mL/hr over 30 Minutes Intravenous 30 min pre-op 11/30/17 4098 11/30/17 0748       Patient was given sequential compression devices and early ambulation to prevent DVT.   Patient benefited maximally from hospital stay and there were no complications. At the time of discharge, the patient was urinating/moving their bowels without difficulty, tolerating a regular diet, pain is controlled with oral pain medications and they have been cleared by PT/OT.   Postoperative course was essentially unremarkable.  She was tolerating a regular diet and had no airway complications or swelling.  She denied any difficulty breathing or significant this aphasia.  Patient was seen and cleared by physical therapy prior to discharge.  Recent vital signs: No data found.   Recent laboratory studies: No results for input(s): WBC, HGB, HCT, PLT, NA, K, CL, CO2, BUN, CREATININE, GLUCOSE, INR, CALCIUM in the last 72 hours.  Invalid input(s): PT, 2   Discharge Medications:   Allergies as of 12/01/2017      Reactions   Gluten Meal Other (See Comments)   Joint problems      Medication List    STOP taking these medications   magnesium gluconate 500 MG tablet Commonly known as:  MAGONATE   OVER THE COUNTER MEDICATION   tretinoin 0.05 % cream Commonly known as:  RETIN-A     TAKE these medications   busPIRone 15 MG tablet Commonly known as:  BUSPAR Take 5-7.5 mg by mouth 2 (two) times daily as needed (for anxiety).   CALCIUM PO Take 1 tablet by mouth daily.   CONCERTA 36 MG CR tablet Generic drug:  methylphenidate Take 36 mg by  mouth daily.   DRY EYES OP Place 1 drop into both eyes 3 (three) times daily as needed (for dry eyes).   FISH OIL PO Take 1 capsule by mouth 3 (three) times daily.   MULTIVITAMIN PO Take 1 capsule by mouth 3 (three) times daily.   norethindrone-ethinyl estradiol 1-20 MG-MCG tablet Commonly known as:  JUNEL FE,GILDESS FE,LOESTRIN FE Take 1 tablet by mouth daily.   ondansetron 4 MG tablet Commonly known as:  ZOFRAN Take 1 tablet (4 mg total) by  mouth every 8 (eight) hours as needed for nausea or vomiting.   oxyCODONE-acetaminophen 10-325 MG tablet Commonly known as:  PERCOCET Take 1 tablet by mouth every 4 (four) hours as needed for pain.     ASK your doctor about these medications   methocarbamol 500 MG tablet Commonly known as:  ROBAXIN Take 1 tablet (500 mg total) by mouth 3 (three) times daily as needed for up to 5 days for muscle spasms. Ask about: Should I take this medication?       Diagnostic Studies: Dg Cervical Spine 2-3 Views  Result Date: 11/30/2017 CLINICAL DATA:  37 year old female undergoing cervical spine surgery. EXAM: CERVICAL SPINE - 2-3 VIEW; DG C-ARM 61-120 MIN COMPARISON:  Brain MRI 04/24/2017. FINDINGS: Three intraoperative fluoroscopic spot views of the cervical spine in the AP and lateral projection demonstrate C5 through C7 anterior fusion hardware with anterior plate, cortical screws, and interbody implants at C5-C6 and C6-C7. no adverse hardware features. FLUOROSCOPY TIME:  0 minutes 35 seconds IMPRESSION: C5-C6 and C6-C7 ACDF. Electronically Signed   By: Odessa FlemingH  Hall M.D.   On: 11/30/2017 11:24   Dg C-arm 1-60 Min  Result Date: 11/30/2017 CLINICAL DATA:  37 year old female undergoing cervical spine surgery. EXAM: CERVICAL SPINE - 2-3 VIEW; DG C-ARM 61-120 MIN COMPARISON:  Brain MRI 04/24/2017. FINDINGS: Three intraoperative fluoroscopic spot views of the cervical spine in the AP and lateral projection demonstrate C5 through C7 anterior fusion hardware with anterior plate, cortical screws, and interbody implants at C5-C6 and C6-C7. no adverse hardware features. FLUOROSCOPY TIME:  0 minutes 35 seconds IMPRESSION: C5-C6 and C6-C7 ACDF. Electronically Signed   By: Odessa FlemingH  Hall M.D.   On: 11/30/2017 11:24   Dg C-arm 1-60 Min  Result Date: 11/30/2017 CLINICAL DATA:  37 year old female undergoing cervical spine surgery. EXAM: CERVICAL SPINE - 2-3 VIEW; DG C-ARM 61-120 MIN COMPARISON:  Brain MRI 04/24/2017. FINDINGS:  Three intraoperative fluoroscopic spot views of the cervical spine in the AP and lateral projection demonstrate C5 through C7 anterior fusion hardware with anterior plate, cortical screws, and interbody implants at C5-C6 and C6-C7. no adverse hardware features. FLUOROSCOPY TIME:  0 minutes 35 seconds IMPRESSION: C5-C6 and C6-C7 ACDF. Electronically Signed   By: Odessa FlemingH  Hall M.D.   On: 11/30/2017 11:24    Discharge Instructions    Incentive spirometry RT   Complete by:  As directed         Discharge Plan:  discharge to home.  Patient was given a prescription for pain medication, antiemetic, and muscle relaxer.  Wound care instructions were also provided.    Disposition: Patient will follow-up with me in 2 weeks for repeat evaluation.    Signed: Venita LickBROOKS,Daliana Leverett D for Dr. Venita Lickahari Tessica Cupo [facility] [phone] 12/13/2017, 1:10 PM

## 2017-12-15 DIAGNOSIS — M5412 Radiculopathy, cervical region: Secondary | ICD-10-CM | POA: Diagnosis not present

## 2017-12-21 DIAGNOSIS — F4323 Adjustment disorder with mixed anxiety and depressed mood: Secondary | ICD-10-CM | POA: Diagnosis not present

## 2018-01-15 DIAGNOSIS — Z5189 Encounter for other specified aftercare: Secondary | ICD-10-CM | POA: Diagnosis not present

## 2018-01-19 DIAGNOSIS — G44229 Chronic tension-type headache, not intractable: Secondary | ICD-10-CM | POA: Diagnosis not present

## 2018-01-19 DIAGNOSIS — R5382 Chronic fatigue, unspecified: Secondary | ICD-10-CM | POA: Diagnosis not present

## 2018-01-19 DIAGNOSIS — E559 Vitamin D deficiency, unspecified: Secondary | ICD-10-CM | POA: Diagnosis not present

## 2018-01-19 DIAGNOSIS — B002 Herpesviral gingivostomatitis and pharyngotonsillitis: Secondary | ICD-10-CM | POA: Diagnosis not present

## 2018-01-19 DIAGNOSIS — E538 Deficiency of other specified B group vitamins: Secondary | ICD-10-CM | POA: Diagnosis not present

## 2018-01-19 DIAGNOSIS — D649 Anemia, unspecified: Secondary | ICD-10-CM | POA: Diagnosis not present

## 2018-01-19 DIAGNOSIS — N912 Amenorrhea, unspecified: Secondary | ICD-10-CM | POA: Diagnosis not present

## 2018-01-19 DIAGNOSIS — R946 Abnormal results of thyroid function studies: Secondary | ICD-10-CM | POA: Diagnosis not present

## 2018-01-23 DIAGNOSIS — M542 Cervicalgia: Secondary | ICD-10-CM | POA: Diagnosis not present

## 2018-01-25 ENCOUNTER — Ambulatory Visit (INDEPENDENT_AMBULATORY_CARE_PROVIDER_SITE_OTHER): Payer: BLUE CROSS/BLUE SHIELD | Admitting: Internal Medicine

## 2018-02-02 DIAGNOSIS — M542 Cervicalgia: Secondary | ICD-10-CM | POA: Diagnosis not present

## 2018-02-07 DIAGNOSIS — M542 Cervicalgia: Secondary | ICD-10-CM | POA: Diagnosis not present

## 2018-02-08 DIAGNOSIS — F4310 Post-traumatic stress disorder, unspecified: Secondary | ICD-10-CM | POA: Diagnosis not present

## 2018-02-08 DIAGNOSIS — F411 Generalized anxiety disorder: Secondary | ICD-10-CM | POA: Diagnosis not present

## 2018-02-12 DIAGNOSIS — M542 Cervicalgia: Secondary | ICD-10-CM | POA: Diagnosis not present

## 2018-02-13 DIAGNOSIS — F4323 Adjustment disorder with mixed anxiety and depressed mood: Secondary | ICD-10-CM | POA: Diagnosis not present

## 2018-02-20 DIAGNOSIS — M542 Cervicalgia: Secondary | ICD-10-CM | POA: Diagnosis not present

## 2018-02-21 DIAGNOSIS — L720 Epidermal cyst: Secondary | ICD-10-CM | POA: Diagnosis not present

## 2018-02-21 DIAGNOSIS — L700 Acne vulgaris: Secondary | ICD-10-CM | POA: Diagnosis not present

## 2018-02-21 DIAGNOSIS — L570 Actinic keratosis: Secondary | ICD-10-CM | POA: Diagnosis not present

## 2018-02-21 DIAGNOSIS — Z85828 Personal history of other malignant neoplasm of skin: Secondary | ICD-10-CM | POA: Diagnosis not present

## 2018-02-22 DIAGNOSIS — F4323 Adjustment disorder with mixed anxiety and depressed mood: Secondary | ICD-10-CM | POA: Diagnosis not present

## 2018-02-27 DIAGNOSIS — Z4889 Encounter for other specified surgical aftercare: Secondary | ICD-10-CM | POA: Diagnosis not present

## 2018-03-01 DIAGNOSIS — M542 Cervicalgia: Secondary | ICD-10-CM | POA: Diagnosis not present

## 2018-03-08 DIAGNOSIS — M542 Cervicalgia: Secondary | ICD-10-CM | POA: Diagnosis not present

## 2018-03-08 DIAGNOSIS — F4323 Adjustment disorder with mixed anxiety and depressed mood: Secondary | ICD-10-CM | POA: Diagnosis not present

## 2018-03-15 DIAGNOSIS — Z85828 Personal history of other malignant neoplasm of skin: Secondary | ICD-10-CM | POA: Diagnosis not present

## 2018-03-15 DIAGNOSIS — L570 Actinic keratosis: Secondary | ICD-10-CM | POA: Diagnosis not present

## 2018-03-15 DIAGNOSIS — L700 Acne vulgaris: Secondary | ICD-10-CM | POA: Diagnosis not present

## 2018-03-15 DIAGNOSIS — L814 Other melanin hyperpigmentation: Secondary | ICD-10-CM | POA: Diagnosis not present

## 2018-03-15 DIAGNOSIS — D1801 Hemangioma of skin and subcutaneous tissue: Secondary | ICD-10-CM | POA: Diagnosis not present

## 2018-03-15 DIAGNOSIS — M542 Cervicalgia: Secondary | ICD-10-CM | POA: Diagnosis not present

## 2018-03-22 DIAGNOSIS — F4310 Post-traumatic stress disorder, unspecified: Secondary | ICD-10-CM | POA: Diagnosis not present

## 2018-03-22 DIAGNOSIS — F411 Generalized anxiety disorder: Secondary | ICD-10-CM | POA: Diagnosis not present

## 2018-03-29 DIAGNOSIS — F4323 Adjustment disorder with mixed anxiety and depressed mood: Secondary | ICD-10-CM | POA: Diagnosis not present

## 2018-04-02 DIAGNOSIS — F4323 Adjustment disorder with mixed anxiety and depressed mood: Secondary | ICD-10-CM | POA: Diagnosis not present

## 2018-04-19 DIAGNOSIS — F4323 Adjustment disorder with mixed anxiety and depressed mood: Secondary | ICD-10-CM | POA: Diagnosis not present

## 2018-04-24 DIAGNOSIS — G44209 Tension-type headache, unspecified, not intractable: Secondary | ICD-10-CM | POA: Diagnosis not present

## 2018-04-24 DIAGNOSIS — L700 Acne vulgaris: Secondary | ICD-10-CM | POA: Diagnosis not present

## 2018-04-24 DIAGNOSIS — B002 Herpesviral gingivostomatitis and pharyngotonsillitis: Secondary | ICD-10-CM | POA: Diagnosis not present

## 2018-04-24 DIAGNOSIS — F4323 Adjustment disorder with mixed anxiety and depressed mood: Secondary | ICD-10-CM | POA: Diagnosis not present

## 2018-04-24 DIAGNOSIS — E559 Vitamin D deficiency, unspecified: Secondary | ICD-10-CM | POA: Diagnosis not present

## 2018-04-26 DIAGNOSIS — F4310 Post-traumatic stress disorder, unspecified: Secondary | ICD-10-CM | POA: Diagnosis not present

## 2018-04-26 DIAGNOSIS — F411 Generalized anxiety disorder: Secondary | ICD-10-CM | POA: Diagnosis not present

## 2018-04-26 DIAGNOSIS — M542 Cervicalgia: Secondary | ICD-10-CM | POA: Diagnosis not present

## 2018-05-02 DIAGNOSIS — F4323 Adjustment disorder with mixed anxiety and depressed mood: Secondary | ICD-10-CM | POA: Diagnosis not present

## 2018-05-10 DIAGNOSIS — F4323 Adjustment disorder with mixed anxiety and depressed mood: Secondary | ICD-10-CM | POA: Diagnosis not present

## 2018-05-17 DIAGNOSIS — F4323 Adjustment disorder with mixed anxiety and depressed mood: Secondary | ICD-10-CM | POA: Diagnosis not present

## 2018-05-31 DIAGNOSIS — F4323 Adjustment disorder with mixed anxiety and depressed mood: Secondary | ICD-10-CM | POA: Diagnosis not present

## 2018-06-14 DIAGNOSIS — F4323 Adjustment disorder with mixed anxiety and depressed mood: Secondary | ICD-10-CM | POA: Diagnosis not present

## 2018-06-21 DIAGNOSIS — F411 Generalized anxiety disorder: Secondary | ICD-10-CM | POA: Diagnosis not present

## 2018-06-21 DIAGNOSIS — L700 Acne vulgaris: Secondary | ICD-10-CM | POA: Diagnosis not present

## 2018-06-21 DIAGNOSIS — F4310 Post-traumatic stress disorder, unspecified: Secondary | ICD-10-CM | POA: Diagnosis not present

## 2018-06-21 DIAGNOSIS — Z85828 Personal history of other malignant neoplasm of skin: Secondary | ICD-10-CM | POA: Diagnosis not present

## 2018-06-22 DIAGNOSIS — Z981 Arthrodesis status: Secondary | ICD-10-CM | POA: Diagnosis not present

## 2018-07-11 DIAGNOSIS — F4323 Adjustment disorder with mixed anxiety and depressed mood: Secondary | ICD-10-CM | POA: Diagnosis not present

## 2018-07-13 DIAGNOSIS — G44209 Tension-type headache, unspecified, not intractable: Secondary | ICD-10-CM | POA: Diagnosis not present

## 2018-07-13 DIAGNOSIS — F419 Anxiety disorder, unspecified: Secondary | ICD-10-CM | POA: Diagnosis not present

## 2018-07-13 DIAGNOSIS — R5382 Chronic fatigue, unspecified: Secondary | ICD-10-CM | POA: Diagnosis not present

## 2018-07-13 DIAGNOSIS — E559 Vitamin D deficiency, unspecified: Secondary | ICD-10-CM | POA: Diagnosis not present

## 2018-07-16 DIAGNOSIS — M542 Cervicalgia: Secondary | ICD-10-CM | POA: Diagnosis not present

## 2018-07-24 DIAGNOSIS — M542 Cervicalgia: Secondary | ICD-10-CM | POA: Diagnosis not present

## 2018-07-25 DIAGNOSIS — F4310 Post-traumatic stress disorder, unspecified: Secondary | ICD-10-CM | POA: Diagnosis not present

## 2018-07-25 DIAGNOSIS — F411 Generalized anxiety disorder: Secondary | ICD-10-CM | POA: Diagnosis not present

## 2018-07-30 DIAGNOSIS — M542 Cervicalgia: Secondary | ICD-10-CM | POA: Diagnosis not present

## 2018-08-06 DIAGNOSIS — F4323 Adjustment disorder with mixed anxiety and depressed mood: Secondary | ICD-10-CM | POA: Diagnosis not present

## 2018-08-07 DIAGNOSIS — M542 Cervicalgia: Secondary | ICD-10-CM | POA: Diagnosis not present

## 2018-09-03 DIAGNOSIS — F4323 Adjustment disorder with mixed anxiety and depressed mood: Secondary | ICD-10-CM | POA: Diagnosis not present

## 2018-09-10 DIAGNOSIS — Z8379 Family history of other diseases of the digestive system: Secondary | ICD-10-CM | POA: Diagnosis not present

## 2018-09-10 DIAGNOSIS — K9041 Non-celiac gluten sensitivity: Secondary | ICD-10-CM | POA: Diagnosis not present

## 2018-09-12 DIAGNOSIS — F411 Generalized anxiety disorder: Secondary | ICD-10-CM | POA: Diagnosis not present

## 2018-09-12 DIAGNOSIS — F4310 Post-traumatic stress disorder, unspecified: Secondary | ICD-10-CM | POA: Diagnosis not present

## 2018-09-13 DIAGNOSIS — F4323 Adjustment disorder with mixed anxiety and depressed mood: Secondary | ICD-10-CM | POA: Diagnosis not present

## 2018-09-14 DIAGNOSIS — K9041 Non-celiac gluten sensitivity: Secondary | ICD-10-CM | POA: Diagnosis not present

## 2018-09-24 DIAGNOSIS — F4323 Adjustment disorder with mixed anxiety and depressed mood: Secondary | ICD-10-CM | POA: Diagnosis not present

## 2018-10-02 DIAGNOSIS — F4323 Adjustment disorder with mixed anxiety and depressed mood: Secondary | ICD-10-CM | POA: Diagnosis not present

## 2018-10-12 ENCOUNTER — Other Ambulatory Visit: Payer: Self-pay

## 2018-10-12 DIAGNOSIS — Z20822 Contact with and (suspected) exposure to covid-19: Secondary | ICD-10-CM

## 2018-10-12 DIAGNOSIS — R6889 Other general symptoms and signs: Secondary | ICD-10-CM | POA: Diagnosis not present

## 2018-10-13 LAB — NOVEL CORONAVIRUS, NAA: SARS-CoV-2, NAA: NOT DETECTED

## 2018-10-16 DIAGNOSIS — F4323 Adjustment disorder with mixed anxiety and depressed mood: Secondary | ICD-10-CM | POA: Diagnosis not present

## 2018-10-24 ENCOUNTER — Encounter: Payer: Self-pay | Admitting: *Deleted

## 2018-10-29 ENCOUNTER — Encounter: Payer: Self-pay | Admitting: Diagnostic Neuroimaging

## 2018-10-29 ENCOUNTER — Ambulatory Visit: Payer: BC Managed Care – PPO | Admitting: Diagnostic Neuroimaging

## 2018-10-29 ENCOUNTER — Encounter

## 2018-10-29 ENCOUNTER — Other Ambulatory Visit: Payer: Self-pay

## 2018-10-29 VITALS — BP 135/83 | HR 106 | Temp 97.8°F | Ht 64.0 in | Wt 119.4 lb

## 2018-10-29 DIAGNOSIS — G43109 Migraine with aura, not intractable, without status migrainosus: Secondary | ICD-10-CM | POA: Diagnosis not present

## 2018-10-29 DIAGNOSIS — R413 Other amnesia: Secondary | ICD-10-CM

## 2018-10-29 DIAGNOSIS — M62838 Other muscle spasm: Secondary | ICD-10-CM

## 2018-10-29 DIAGNOSIS — R5382 Chronic fatigue, unspecified: Secondary | ICD-10-CM | POA: Diagnosis not present

## 2018-10-29 DIAGNOSIS — F4323 Adjustment disorder with mixed anxiety and depressed mood: Secondary | ICD-10-CM | POA: Diagnosis not present

## 2018-10-29 MED ORDER — RIZATRIPTAN BENZOATE 10 MG PO TBDP: 10.0000 mg | ORAL_TABLET | ORAL | 11 refills | Status: DC | PRN

## 2018-10-29 MED ORDER — AIMOVIG 70 MG/ML ~~LOC~~ SOAJ: 70.0000 mg | SUBCUTANEOUS | 4 refills | Status: DC

## 2018-10-29 NOTE — Progress Notes (Signed)
[facility]  PATIENT: Tina Haney DOB: 1981/01/29  REFERRING CLINICIAN: Milas Gain, MD HISTORY FROM: patient and chart review REASON FOR VISIT: new consult / existing patient   HISTORICAL  CHIEF COMPLAINT:  Chief Complaint  Patient presents with  . Muscle Spasm    rm 7 FU    HISTORY OF PRESENT ILLNESS:   UPDATE (10/29/18, VRP): 38 year old female with headaches. Since last visit, here for new consult re: headaches and migraine. Has had tension HA in the past for many years (dull, band pressure). In last few years, having intermittent HA, with photophobia, severe. In August 2020 had right sided headache, vision blurred, funhouse mirror vision, left hand tingling. Using almost daily fioricet for daily headaches. Family history of migraine in father and sister. Still with brain fog, muscle spasms, fatigue.   PRIOR HPI (04/25/17): 38 year old female here for evaluation of muscle twitching, muscle spasm, fatigue, weakness, headaches, brain fog, eye twitching, blurred vision, since 2012.  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  By 2012 patient had lost weight, had GI symptoms with abdominal pain, and was evaluated by specialist.  She was diagnosed with vitamin deficiencies and iron deficiencies.  These were treated.  Continue to progressively worsen.  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  She was recommended to treat these conservatively.  Patient has had a vegan diet since age 49 up to 38 years old.  She has taken B12 supplements in the past but was found to be low in 2012.  Recent B12 levels have been normal.  For past 2 years patient has been eating some meat products in her diet.  Patient had MRI of the brain yesterday which we reviewed together.  Patient has  had positive ANA in the past and seen rheumatologist in [place].  She is also planning to see a rheumatologist in [employer] soon.  Patient has had significant depression, anxiety, PTSD, chronic stress syndrome diagnoses in the past.  She feels like these are improved compared to previously.    REVIEW OF SYSTEMS: Full 14 system review of systems performed and negative with exception of: muscle spasm, fatigue, brain fog.    ALLERGIES: Allergies  Allergen Reactions  . Gluten Meal Other (See Comments)    Joint problems  . Aleve [Naproxen Sodium] Rash  . Percocet [Oxycodone-Acetaminophen] Rash    Itching, throat closing    HOME MEDICATIONS: Outpatient Medications Prior to Visit  Medication Sig Dispense Refill  . Artificial Tear Ointment (DRY EYES OP) Place 1 drop into both eyes 3 (three) times daily as needed (for dry eyes).    . Butalbital-APAP-Caffeine 50-300-40 MG CAPS TK 1 C PO Q 4 H PRN    . CALCIUM PO Take 1 tablet by mouth daily.     . CONCERTA 36 MG CR tablet Take 36 mg by mouth daily.  0  . DULoxetine (CYMBALTA) 60 MG capsule 60 mg daily.    . Multiple Vitamins-Minerals (MULTIVITAMIN PO) Take 1 capsule by mouth 3 (three) times daily.    . Omega-3 Fatty Acids (FISH OIL PO) Take 1 capsule by mouth 3 (three) times daily.    Marland Kitchen UNABLE TO FIND Med Name: Optiferin-C 100 mg vit C, 11 mg calcium, Iron 28 mg    . valACYclovir (VALTREX) 1000 MG tablet Take 1,000 mg by mouth  daily.    . norethindrone-ethinyl estradiol (JUNEL FE,GILDESS FE,LOESTRIN FE) 1-20 MG-MCG tablet Take 1 tablet by mouth daily.    . busPIRone (BUSPAR) 15 MG tablet Take 5-7.5 mg by mouth 2 (two) times daily as needed (for anxiety).   0  . ondansetron (ZOFRAN) 4 MG tablet Take 1 tablet (4 mg total) by mouth every 8 (eight) hours as needed for nausea or vomiting. 20 tablet 0  . oxyCODONE-acetaminophen (PERCOCET) 10-325 MG tablet Take 1 tablet by mouth every 4 (four) hours as needed for pain. 30 tablet 0   No  facility-administered medications prior to visit.     PAST MEDICAL HISTORY: Past Medical History:  Diagnosis Date  . Acute Epstein Barr virus (EBV) infection   . Anemia   . Arthritis   . Chronic fatigue   . Chronic posttraumatic stress syndrome   . Concussion    09/2015,  Was 2nd or 3rd concussion.   . Esophagitis   . GERD (gastroesophageal reflux disease)   . Headache   . Herniated disc, cervical    C5/6 and 6/7   . Migraines   . Murmur, heart    as a child   . Paresthesia   . Polyp of colon   . PTSD (post-traumatic stress disorder)   . Thyroid nodule     PAST SURGICAL HISTORY: Past Surgical History:  Procedure Laterality Date  . ANTERIOR CERVICAL DECOMP/DISCECTOMY FUSION N/A 11/30/2017   Procedure: ANTERIOR CERVICAL DECOMPRESSION/DISCECTOMY FUSION CERVICAL 5-7;  Surgeon: Venita Lick, MD;  Location: [facility] OR;  Service: Orthopedics;  Laterality: N/A;  3.5 hrs  . COLONOSCOPY     Done in [place], 2 polyps, benign  . DENTAL SURGERY    . UPPER GI ENDOSCOPY     recent EGD done in [place] by Dr. Uvaldo Rising    FAMILY HISTORY: Family History  Problem Relation Age of Onset  . Bipolar disorder Mother   . ADD / ADHD Mother   . Celiac disease Daughter   . Cancer Maternal Grandmother        breast  . Alzheimer's disease Maternal Grandmother   . Kidney disease Maternal Grandfather   . Migraines Father   . Dementia Father   . Skin cancer Father   . Eczema Son   . ADD / ADHD Son   . ADD / ADHD Daughter     SOCIAL HISTORY:  Social History   Socioeconomic History  . Marital status: Married    Spouse name: Bernette Redbird  . Number of children: 4  . Years of education: Not on file  . Highest education level: Not on file  Occupational History    Comment: na  Social Needs  . Financial resource strain: Not on file  . Food insecurity    Worry: Not on file    Inability: Not on file  . Transportation needs    Medical: Not on file    Non-medical: Not on file  Tobacco Use  .  Smoking status: Never Smoker  . Smokeless tobacco: Never Used  Substance and Sexual Activity  . Alcohol use: No    Frequency: Never  . Drug use: No  . Sexual activity: Yes    Partners: Male    Birth control/protection: Surgical, Pill    Comment: husband vastectomy  Lifestyle  . Physical activity    Days per week: Not on file    Minutes per session: Not on file  . Stress: Not on file  Relationships  . Social connections  Talks on phone: Not on file    Gets together: Not on file    Attends religious service: Not on file    Active member of club or organization: Not on file    Attends meetings of clubs or organizations: Not on file    Relationship status: Not on file  . Intimate partner violence    Fear of current or ex partner: Not on file    Emotionally abused: Not on file    Physically abused: Not on file    Forced sexual activity: Not on file  Other Topics Concern  . Not on file  Social History Narrative   Born in [place]. Grew up in [place] and [place]. Married with four children. Married for 15 years-12, 11, 10, 7. Attend school at [employer].   Education: BS some masters work.  Work: just had to resign.  Caffiene: one cup of tea / day.    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    Came back to the [place] in 07/2016.    Seen by Functional medicine doctor and had lots of labs done and put on lots of supplement.      PHYSICAL EXAM  GENERAL EXAM/CONSTITUTIONAL: Vitals:  Vitals:   10/29/18 0903  BP: 135/83  Pulse: (!) 106  Temp: 97.8 F (36.6 C)  Weight: 119 lb 6.4 oz (54.2 kg)  Height: 5\' 4"  (1.626 m)   Wt Readings from Last 10 Encounters:  10/29/18 119 lb 6.4 oz (54.2 kg)  11/21/17 118 lb 4.8 oz (53.7 kg)  09/04/17 119 lb (54 kg)  08/10/17 116 lb 1.9 oz (52.7 kg)  05/25/17 112 lb (50.8 kg)  05/22/17 116 lb 12.8 oz (53 kg)  05/01/17 117 lb (53.1 kg)  04/27/17 113 lb 8 oz (51.5 kg)   04/25/17 113 lb 6.4 oz (51.4 kg)  04/13/17 112 lb (50.8 kg)     Body mass index is 20.49 kg/m. No exam data present  Patient is in no distress; well developed, nourished and groomed; neck is supple  CARDIOVASCULAR:  Examination of carotid arteries is normal; no carotid bruits  Regular rate and rhythm, no murmurs  Examination of peripheral vascular system by observation and palpation is normal  EYES:  Ophthalmoscopic exam of optic discs and posterior segments is normal; no papilledema or hemorrhages  MUSCULOSKELETAL:  Gait, strength, tone, movements noted in Neurologic exam below  NEUROLOGIC: MENTAL STATUS:  No flowsheet data found.  awake, alert, oriented to person, place and time  recent and remote memory intact  normal attention and concentration  language fluent, comprehension intact, naming intact,   fund of knowledge appropriate  CRANIAL NERVE:   2nd - no papilledema on fundoscopic exam  2nd, 3rd, 4th, 6th - pupils equal and reactive to light, visual fields full to confrontation, extraocular muscles intact, no nystagmus  5th - facial sensation symmetric  7th - facial strength symmetric  8th - hearing intact  9th - palate elevates symmetrically, uvula midline  11th - shoulder shrug symmetric  12th - tongue protrusion midline  MOTOR:   normal bulk and tone, full strength in the BUE, BLE  SENSORY:  normal and symmetric to light touch, temperature, vibration; PATCHY DECR PP IN FINGERS AND FEET COORDINATION:   finger-nose-finger, fine finger movements normal  REFLEXES:   deep tendon reflexes present and symmetric  GAIT/STATION:   narrow based gait; able to walk on toes, heels  and tandem; romberg is negative    DIAGNOSTIC DATA (LABS, IMAGING, TESTING) - I reviewed patient records, labs, notes, testing and imaging myself where available.  Lab Results  Component Value Date   WBC 5.2 11/21/2017   HGB 12.4 11/21/2017   HCT 39.5  11/21/2017   MCV 88.0 11/21/2017   PLT 365 11/21/2017      Component Value Date/Time   NA 136 04/03/2017 1312   K 4.1 04/03/2017 1312   CL 100 (L) 04/03/2017 1312   CO2 26 04/03/2017 1312   GLUCOSE 104 (H) 04/03/2017 1312   BUN 11 04/03/2017 1312   CREATININE 0.87 04/03/2017 1312   CALCIUM 9.4 04/03/2017 1312   PROT 6.7 04/03/2017 1312   ALBUMIN 4.1 04/03/2017 1312   AST 19 04/03/2017 1312   ALT 17 04/03/2017 1312   ALKPHOS 63 04/03/2017 1312   BILITOT 0.4 04/03/2017 1312   GFRNONAA >60 04/03/2017 1312   GFRAA >60 04/03/2017 1312   No results found for: CHOL, HDL, LDLCALC, LDLDIRECT, TRIG, CHOLHDL No results found for: [id number] Lab Results  Component Value Date   VITAMINB12 951 04/13/2017   Lab Results  Component Value Date   TSH 0.96 05/25/2017   Lab Results  Component Value Date   CKTOTAL 89 04/13/2017    04/27/16 EMG/NCS (report from [place]) - 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 -VRP]  - Normal examination. No abnormality seen to explain headache or the other presenting symptoms.    ASSESSMENT AND PLAN  38 y.o. year old female here with constellation of symptoms including numbness, weakness, muscle spasm, muscle twitching, fatigue, mental processing difficulty, brain fog, history of concussions, history of positive ANA, history of PTSD.  Neurologic examination, MRI brain are unremarkable.     Dx:  1. Migraine with aura and without status migrainosus, not intractable   2. Muscle spasm   3. Memory loss   4. Chronic fatigue      PLAN:  MIGRAINE WITH AURA  MIGRAINE RESCUE --> rizatriptan 10mg  as needed for breakthrough headache; may repeat x 1 after 2 hours; max 2 tabs per day or 8 per month  MIGRAINE PREVENTION  - cannot take topiramate (due to weight loss),  propranolol (due to fatigue and depression), TCA or SSRI (already on duloxetine) - start CGRP antagonist (aimovig) - consider depakote (patient currently having weight loss and mood issues) - consider botox  MUSCLE SPASMS / FATIGUE / BRAIN FOG - repeat MRI brain   Meds ordered this encounter  Medications  . rizatriptan (MAXALT-MLT) 10 MG disintegrating tablet    Sig: Take 1 tablet (10 mg total) by mouth as needed for migraine. May repeat in 2 hours if needed    Dispense:  9 tablet    Refill:  11  . Erenumab-aooe (AIMOVIG) 70 MG/ML SOAJ    Sig: Inject 70 mg into the skin every 30 (thirty) days.    Dispense:  3 pen    Refill:  4   Orders Placed This Encounter  Procedures  . MR BRAIN W WO CONTRAST   Return in about 4 months (around 02/28/2019) for with NP (Amy Lomax).     Suanne MarkerVIKRAM R. , MD 10/29/2018, 9:13 AM Certified in Neurology, Neurophysiology and Neuroimaging  [facility] [address], [address] [place], [place] [id number] [phone]

## 2018-10-29 NOTE — Patient Instructions (Signed)
MIGRAINE WITH AURA  MIGRAINE RESCUE  --> rizatriptan 10mg  as needed for breakthrough headache; may repeat x 1 after 2 hours; max 2 tabs per day or 8 per month --> reduce fioricet to < 10 per month  MIGRAINE PREVENTION  - start aimovig monthly injections - consider depakote - consider botox  MUSCLE SPASMS / FATIGUE / BRAIN FOG - repeat MRI brain

## 2018-10-30 ENCOUNTER — Encounter: Payer: Self-pay | Admitting: *Deleted

## 2018-10-30 ENCOUNTER — Telehealth: Payer: Self-pay | Admitting: *Deleted

## 2018-10-30 DIAGNOSIS — F411 Generalized anxiety disorder: Secondary | ICD-10-CM | POA: Diagnosis not present

## 2018-10-30 DIAGNOSIS — F4310 Post-traumatic stress disorder, unspecified: Secondary | ICD-10-CM | POA: Diagnosis not present

## 2018-10-30 NOTE — Telephone Encounter (Addendum)
Started Aimovig PA on CMM, key: [id number], dx: G43.109. Cannot take topiramate (due to weight loss), propranolol (due to fatigue and depression), TCA or SSRI (already on duloxetine), NSAIDS (Aleve) caused rash.  - consider depakote (patient currently having weight loss and mood issues). Aimovig immediately approved: Effective from 10/30/2018 through 01/27/2019. Notified patient via my chart. Faxed approval to pharmacy. Of note: patient received savings card yesterday and I educated her on medication administration.

## 2018-10-31 ENCOUNTER — Telehealth: Payer: Self-pay | Admitting: Diagnostic Neuroimaging

## 2018-10-31 NOTE — Telephone Encounter (Signed)
LVM for pt to call back about scheduling mri  BCBS Auth: [id number] (exp. 10/30/18 to 04/27/19)

## 2018-10-31 NOTE — Telephone Encounter (Signed)
Patient returned my call she is scheduled at [facility] for 11/06/18. No to the covid questions.

## 2018-11-06 ENCOUNTER — Ambulatory Visit (INDEPENDENT_AMBULATORY_CARE_PROVIDER_SITE_OTHER): Payer: BC Managed Care – PPO

## 2018-11-06 ENCOUNTER — Other Ambulatory Visit: Payer: Self-pay

## 2018-11-06 DIAGNOSIS — R413 Other amnesia: Secondary | ICD-10-CM

## 2018-11-06 DIAGNOSIS — M62838 Other muscle spasm: Secondary | ICD-10-CM

## 2018-11-06 DIAGNOSIS — R5382 Chronic fatigue, unspecified: Secondary | ICD-10-CM | POA: Diagnosis not present

## 2018-11-06 MED ORDER — GADOBENATE DIMEGLUMINE 529 MG/ML IV SOLN
10.0000 mL | Freq: Once | INTRAVENOUS | Status: AC | PRN
  Administered 2018-11-06: 10 mL via INTRAVENOUS

## 2018-11-12 DIAGNOSIS — F4323 Adjustment disorder with mixed anxiety and depressed mood: Secondary | ICD-10-CM | POA: Diagnosis not present

## 2018-11-13 ENCOUNTER — Telehealth: Payer: Self-pay | Admitting: *Deleted

## 2018-11-13 NOTE — Telephone Encounter (Signed)
Spoke with patient and informed her MRI brain is normal. She had no questions, verbalized understanding, appreciation.

## 2018-11-28 DIAGNOSIS — F4310 Post-traumatic stress disorder, unspecified: Secondary | ICD-10-CM | POA: Diagnosis not present

## 2018-11-28 DIAGNOSIS — F411 Generalized anxiety disorder: Secondary | ICD-10-CM | POA: Diagnosis not present

## 2018-12-06 DIAGNOSIS — F4323 Adjustment disorder with mixed anxiety and depressed mood: Secondary | ICD-10-CM | POA: Diagnosis not present

## 2018-12-17 DIAGNOSIS — F4323 Adjustment disorder with mixed anxiety and depressed mood: Secondary | ICD-10-CM | POA: Diagnosis not present

## 2019-01-03 DIAGNOSIS — F4323 Adjustment disorder with mixed anxiety and depressed mood: Secondary | ICD-10-CM | POA: Diagnosis not present

## 2019-01-15 ENCOUNTER — Encounter: Payer: Self-pay | Admitting: *Deleted

## 2019-01-15 ENCOUNTER — Telehealth: Payer: Self-pay | Admitting: *Deleted

## 2019-01-15 NOTE — Telephone Encounter (Signed)
Started Aimovig PA on [place], key: [id number]. Sent patient my chart message asking if she has had reduction in migraine days or frequency on Aimovig.

## 2019-01-16 ENCOUNTER — Encounter: Payer: Self-pay | Admitting: *Deleted

## 2019-01-16 NOTE — Telephone Encounter (Addendum)
Aimovig approved: Effective from 01/15/2019 through 01/14/2020. Sent patient my chart message.

## 2019-01-17 DIAGNOSIS — F4323 Adjustment disorder with mixed anxiety and depressed mood: Secondary | ICD-10-CM | POA: Diagnosis not present

## 2019-01-21 ENCOUNTER — Telehealth: Payer: Self-pay | Admitting: *Deleted

## 2019-01-21 DIAGNOSIS — G43001 Migraine without aura, not intractable, with status migrainosus: Secondary | ICD-10-CM | POA: Diagnosis not present

## 2019-01-21 MED ORDER — NURTEC 75 MG PO TBDP: 75.0000 mg | ORAL_TABLET | Freq: Every day | ORAL | 6 refills | Status: DC | PRN

## 2019-01-21 NOTE — Telephone Encounter (Signed)
Lovena Le RN infusion stated the patient's migraine was 5/10 on arrival.  After infusion ofdepacon 100 mg, toradol 30 mg, compazine 10 mg IV her pain is 3/10. I advised she let patient know I started PA for Nurtec but she may be able to get for $0 with discount card we provided. Lovena Le will let patient know verbalized understanding.

## 2019-01-21 NOTE — Telephone Encounter (Addendum)
Received call back from patient who stated she'll call family to see if she can get ride. She asked if meds were okay for her to receive with her "autoimmune disorder".  I advised will check with Dr Leta Baptist, will check on infusion times today, tomorrow and call her back in 10 minutes. Patient verbalized understanding, appreciation. Per Dr Leta Baptist, patient may receive Depacon 500 mg IV, may repeat x 1 if needed, Toradol 30 mg IV, Compazine 10 mg IV. Called patient and advised. She'll come today at 1 pm, wear a mask. Orders , insurance  Demographics given to [employer], infusion ste. Nurtec discount card given to [employer] infusion ste to give to patient today.

## 2019-01-21 NOTE — Telephone Encounter (Addendum)
Received from patient : I've had a migraine going on six days now that won't go away but was told at pharmacy prior to this starting when I noticed I was out, that the rescue drug couldn't be refilled even though it has 6 refills on it, until 23rd but I already missed thanksgiving, rather not miss Christmas with my kids too... I've had visual disturbances and nausea etc this being the sixth day now Per Dr Leta Baptist she may have migraine infusion, pending availability either today or tomorrow. I called and left detailed VM, advised if she receives Compazine she must have a driver. Oher meds are Depacon, Toradol. Left #. Also informed her Dr Leta Baptist sent in Rx for new rescue med, but it will require PA. Advised her we have discount cards for it in office.

## 2019-01-21 NOTE — Telephone Encounter (Signed)
Ok to come in for migraine infusion.  Also sent in Nurtec rescue rx. May give rx card in office as well.  Meds ordered this encounter  Medications  . Rimegepant Sulfate (NURTEC) 75 MG TBDP    Sig: Take 75 mg by mouth daily as needed.    Dispense:  8 tablet    Refill:  [place], MD 76/39/4320, 0:37 AM Certified in Neurology, Neurophysiology and [facility] [address], [place], [place] [address] [phone]

## 2019-01-21 NOTE — Telephone Encounter (Addendum)
Started Nurtec PA on CMM, key:  [id number])  Dx: G43.109. Failed- rizatriptan, cannot take topaamx/propranolol/TCA or SSRI.  Your information has been submitted to [place]. [employer] [place] will review the request and fax you a determination directly, typically within 3 business days of your submission once all necessary information is received.  If [employer] has not responded in 3 business days or if you have any questions about your submission, contact [place] at [phone].

## 2019-01-22 ENCOUNTER — Encounter: Payer: Self-pay | Admitting: *Deleted

## 2019-01-22 NOTE — Telephone Encounter (Signed)
Received denial of Nurtec from [place]. Patient must try/fail 2 different triptans. She has only failed rizatriptan. Alternative's include generic sumatriptan, generic zolmitriptan, generic eletriptan. Will message her and have her use Nurtec discount card she received yesterday.

## 2019-02-11 DIAGNOSIS — F4323 Adjustment disorder with mixed anxiety and depressed mood: Secondary | ICD-10-CM | POA: Diagnosis not present

## 2019-02-15 ENCOUNTER — Other Ambulatory Visit: Payer: Self-pay

## 2019-02-15 ENCOUNTER — Ambulatory Visit: Payer: BC Managed Care – PPO | Attending: Internal Medicine

## 2019-02-15 DIAGNOSIS — Z20822 Contact with and (suspected) exposure to covid-19: Secondary | ICD-10-CM

## 2019-02-16 LAB — NOVEL CORONAVIRUS, NAA: SARS-CoV-2, NAA: NOT DETECTED

## 2019-02-21 DIAGNOSIS — F4323 Adjustment disorder with mixed anxiety and depressed mood: Secondary | ICD-10-CM | POA: Diagnosis not present

## 2019-02-28 ENCOUNTER — Encounter: Payer: Self-pay | Admitting: Family Medicine

## 2019-02-28 ENCOUNTER — Telehealth: Payer: Self-pay | Admitting: *Deleted

## 2019-02-28 ENCOUNTER — Telehealth: Payer: Self-pay | Admitting: Family Medicine

## 2019-02-28 ENCOUNTER — Encounter: Payer: Self-pay | Admitting: *Deleted

## 2019-02-28 ENCOUNTER — Other Ambulatory Visit: Payer: Self-pay

## 2019-02-28 ENCOUNTER — Ambulatory Visit: Payer: BC Managed Care – PPO | Admitting: Family Medicine

## 2019-02-28 VITALS — BP 111/79 | HR 112 | Temp 97.4°F | Ht 64.0 in | Wt 116.0 lb

## 2019-02-28 DIAGNOSIS — G43109 Migraine with aura, not intractable, without status migrainosus: Secondary | ICD-10-CM

## 2019-02-28 DIAGNOSIS — M62838 Other muscle spasm: Secondary | ICD-10-CM

## 2019-02-28 MED ORDER — NURTEC 75 MG PO TBDP: 75.0000 mg | ORAL_TABLET | Freq: Every day | ORAL | 6 refills | Status: DC | PRN

## 2019-02-28 MED ORDER — ERENUMAB-AOOE 140 MG/ML ~~LOC~~ SOAJ: 140.0000 mg | SUBCUTANEOUS | 3 refills | Status: DC

## 2019-02-28 NOTE — Telephone Encounter (Signed)
I called and LMVM for [facility] pharmacy to cancel prescriptions for aimovig 70 and 140mg  inject on pt.  To return call if questions.

## 2019-02-28 NOTE — Telephone Encounter (Signed)
Patient states that she needs a letter stating that she is being treated her for migraines.  Wonders if she can get this electronically through MyChart?  Would like a call back please.

## 2019-02-28 NOTE — Progress Notes (Signed)
Sent letter to pt via my chart

## 2019-02-28 NOTE — Patient Instructions (Addendum)
We will discontinue Amovig  Continue Nurtec   We will attempt coverage for Botox. If Botox not covered consider amitriptyline (Elavil) or divalproex (Depakote)  Follow up in 3 months   Migraine Headache A migraine headache is a very strong throbbing pain on one side or both sides of your head. This type of headache can also cause other symptoms. It can last from 4 hours to 3 days. Talk with your doctor about what things may bring on (trigger) this condition. What are the causes? The exact cause of this condition is not known. This condition may be triggered or caused by:  Drinking alcohol.  Smoking.  Taking medicines, such as: ? Medicine used to treat chest pain (nitroglycerin). ? Birth control pills. ? Estrogen. ? Some blood pressure medicines.  Eating or drinking certain products.  Doing physical activity. Other things that may trigger a migraine headache include:  Having a menstrual period.  Pregnancy.  Hunger.  Stress.  Not getting enough sleep or getting too much sleep.  Weather changes.  Tiredness (fatigue). What increases the risk?  Being 6225-40 years old.  Being female.  Having a family history of migraine headaches.  Being Caucasian.  Having depression or anxiety.  Being very overweight. What are the signs or symptoms?  A throbbing pain. This pain may: ? Happen in any area of the head, such as on one side or both sides. ? Make it hard to do daily activities. ? Get worse with physical activity. ? Get worse around bright lights or loud noises.  Other symptoms may include: ? Feeling sick to your stomach (nauseous). ? Vomiting. ? Dizziness. ? Being sensitive to bright lights, loud noises, or smells.  Before you get a migraine headache, you may get warning signs (an aura). An aura may include: ? Seeing flashing lights or having blind spots. ? Seeing bright spots, halos, or zigzag lines. ? Having tunnel vision or blurred vision. ? Having  numbness or a tingling feeling. ? Having trouble talking. ? Having weak muscles.  Some people have symptoms after a migraine headache (postdromal phase), such as: ? Tiredness. ? Trouble thinking (concentrating). How is this treated?  Taking medicines that: ? Relieve pain. ? Relieve the feeling of being sick to your stomach. ? Prevent migraine headaches.  Treatment may also include: ? Having acupuncture. ? Avoiding foods that bring on migraine headaches. ? Learning ways to control your body functions (biofeedback). ? Therapy to help you know and deal with negative thoughts (cognitive behavioral therapy). Follow these instructions at home: Medicines  Take over-the-counter and prescription medicines only as told by your doctor.  Ask your doctor if the medicine prescribed to you: ? Requires you to avoid driving or using heavy machinery. ? Can cause trouble pooping (constipation). You may need to take these steps to prevent or treat trouble pooping:  Drink enough fluid to keep your pee (urine) pale yellow.  Take over-the-counter or prescription medicines.  Eat foods that are high in fiber. These include beans, whole grains, and fresh fruits and vegetables.  Limit foods that are high in fat and sugar. These include fried or sweet foods. Lifestyle  Do not drink alcohol.  Do not use any products that contain nicotine or tobacco, such as cigarettes, e-cigarettes, and chewing tobacco. If you need help quitting, ask your doctor.  Get at least 8 hours of sleep every night.  Limit and deal with stress. General instructions      Keep a journal to find out what  may bring on your migraine headaches. For example, write down: ? What you eat and drink. ? How much sleep you get. ? Any change in what you eat or drink. ? Any change in your medicines.  If you have a migraine headache: ? Avoid things that make your symptoms worse, such as bright lights. ? It may help to lie down in a  dark, quiet room. ? Do not drive or use heavy machinery. ? Ask your doctor what activities are safe for you.  Keep all follow-up visits as told by your doctor. This is important. Contact a doctor if:  You get a migraine headache that is different or worse than others you have had.  You have more than 15 headache days in one month. Get help right away if:  Your migraine headache gets very bad.  Your migraine headache lasts longer than 72 hours.  You have a fever.  You have a stiff neck.  You have trouble seeing.  Your muscles feel weak or like you cannot control them.  You start to lose your balance a lot.  You start to have trouble walking.  You pass out (faint).  You have a seizure. Summary  A migraine headache is a very strong throbbing pain on one side or both sides of your head. These headaches can also cause other symptoms.  This condition may be treated with medicines and changes to your lifestyle.  Keep a journal to find out what may bring on your migraine headaches.  Contact a doctor if you get a migraine headache that is different or worse than others you have had.  Contact your doctor if you have more than 15 headache days in a month. This information is not intended to replace advice given to you by your health care provider. Make sure you discuss any questions you have with your health care provider. Document Revised: 05/11/2018 Document Reviewed: 03/01/2018   Valproic Acid, Divalproex Sodium capsules What is this medicine? VALPROIC ACID (val PROE ik AS id) is used to treat certain types of seizures in patients with epilepsy. This medicine may be used for other purposes; ask your health care provider or pharmacist if you have questions. COMMON BRAND NAME(S): Depakene What should I tell my health care provider before I take this medicine? They need to know if you have any of these conditions:  if you often drink alcohol  kidney disease  liver  disease  low platelet counts  mitochondrial disease  suicidal thoughts, plans, or attempt; a previous suicide attempt by you or a family member  urea cycle disorder (UCD)  an unusual or allergic reaction to divalproex sodium, sodium valproate, valproic acid, other medicines, foods, dyes, or preservatives  pregnant or trying to get pregnant  breast-feeding How should I use this medicine? Take this medicine by mouth with a glass of water. Follow the directions on the prescription label. Do not cut, crush or chew this medicine. You can take it with or without food. If it upsets your stomach, take it with food. Take your doses at regular intervals. Do not take your medicine more often than directed. Do not stop taking except on your doctor's advice. A special MedGuide will be given to you by the pharmacist with each prescription and refill. Be sure to read this information carefully each time. Talk to your pediatrician regarding the use of this medicine in children. While this drug may be prescribed for children as young as 10 years for selected conditions,  precautions do apply. Overdosage: If you think you have taken too much of this medicine contact a poison control center or emergency room at once. NOTE: This medicine is only for you. Do not share this medicine with others. What if I miss a dose? If you miss a dose, take it as soon as you can. If it is almost time for your next dose, take only that dose. Do not take double or extra doses. What may interact with this medicine? Do not take this medicine with any of the following medications:  sodium phenylbutyrate This medicine may also interact with the following medications:  aspirin  certain antibiotics like ertapenem, imipenem, meropenem  certain medicines for depression, anxiety, or psychotic disturbances  certain medicines for seizures like carbamazepine, clonazepam, diazepam, ethosuximide, felbamate, lamotrigine, phenobarbital,  phenytoin, primidone, rufinamide, topiramate  certain medicines that treat or prevent blood clots like warfarin  cholestyramine  female hormones, like estrogens and birth control pills, patches, or rings  propofol  rifampin  ritonavir  tolbutamide  zidovudine This list may not describe all possible interactions. Give your health care provider a list of all the medicines, herbs, non-prescription drugs, or dietary supplements you use. Also tell them if you smoke, drink alcohol, or use illegal drugs. Some items may interact with your medicine. What should I watch for while using this medicine? Tell your doctor or health care provider if your symptoms do not get better or they start to get worse. This medicine may cause serious skin reactions. They can happen weeks to months after starting the medicine. Contact your health care provider right away if you notice fevers or flu-like symptoms with a rash. The rash may be red or purple and then turn into blisters or peeling of the skin. Or, you might notice a red rash with swelling of the face, lips or lymph nodes in your neck or under your arms. Wear a medical ID bracelet or chain, and carry a card that describes your disease and details of your medicine and dosage times. You may get drowsy, dizzy, or have blurred vision. Do not drive, use machinery, or do anything that needs mental alertness until you know how this medicine affects you. To reduce dizzy or fainting spells, do not sit or stand up quickly, especially if you are an older patient. Alcohol can increase drowsiness and dizziness. Avoid alcoholic drinks. This medicine can make you more sensitive to the sun. Keep out of the sun. If you cannot avoid being in the sun, wear protective clothing and use sunscreen. Do not use sun lamps or tanning beds/booths. Patients and their families should watch out for new or worsening depression or thoughts of suicide. Also watch out for sudden changes in  feelings such as feeling anxious, agitated, panicky, irritable, hostile, aggressive, impulsive, severely restless, overly excited and hyperactive, or not being able to sleep. If this happens, especially at the beginning of treatment or after a change in dose, call your health care provider. Women should inform their doctor if they wish to become pregnant or think they might be pregnant. There is a potential for serious side effects to an unborn child. Talk to your health care provider or pharmacist for more information. Women who become pregnant while using this medicine may enroll in the [place] [employer] Antiepileptic Drug Pregnancy Registry by calling [phone]. This registry collects information about the safety of antiepileptic drug use during pregnancy. This medicine may cause a decrease in folic acid and vitamin D. You should make  sure that you get enough vitamins while you are taking this medicine. Discuss the foods you eat and the vitamins you take with your health care provider. What side effects may I notice from receiving this medicine? Side effects that you should report to your doctor or health care professional as soon as possible:  allergic reactions like skin rash, itching or hives, swelling of the face, lips, or tongue  changes in vision  rash, fever, and swollen lymph nodes  redness, blistering, peeling or loosening of the skin, including inside the mouth  signs and symptoms of liver injury like dark yellow or brown urine; general ill feeling or flu-like symptoms; light-colored stools; loss of appetite; nausea; right upper belly pain; unusually weak or tired; yellowing of the eyes or skin  suicidal thoughts or other mood changes  unusual bleeding or bruising Side effects that usually do not require medical attention (report to your doctor or health care professional if they continue or are bothersome):  constipation  diarrhea  dizziness  hair loss  headache  loss  of appetite  weight gain This list may not describe all possible side effects. Call your doctor for medical advice about side effects. You may report side effects to FDA at 1-800-FDA-1088. Where should I keep my medicine? Keep out of reach of children. Store at room temperature between 15 and 25 degrees C (59 and 77 degrees F). Keep container tightly closed. Throw away any unused medicine after the expiration date. NOTE: This sheet is a summary. It may not cover all possible information. If you have questions about this medicine, talk to your doctor, pharmacist, or health care provider.  2020 Elsevier/Gold Standard (2018-04-27 16:01:44)   Amitriptyline tablets What is this medicine? AMITRIPTYLINE (a mee TRIP ti leen) is used to treat depression. This medicine may be used for other purposes; ask your health care provider or pharmacist if you have questions. COMMON BRAND NAME(S): Elavil, Vanatrip What should I tell my health care provider before I take this medicine? They need to know if you have any of these conditions:  an alcohol problem  asthma, difficulty breathing  bipolar disorder or schizophrenia  difficulty passing urine, prostate trouble  glaucoma  heart disease or previous heart attack  liver disease  over active thyroid  seizures  thoughts or plans of suicide, a previous suicide attempt, or family history of suicide attempt  an unusual or allergic reaction to amitriptyline, other medicines, foods, dyes, or preservatives  pregnant or trying to get pregnant  breast-feeding How should I use this medicine? Take this medicine by mouth with a drink of water. Follow the directions on the prescription label. You can take the tablets with or without food. Take your medicine at regular intervals. Do not take it more often than directed. Do not stop taking this medicine suddenly except upon the advice of your doctor. Stopping this medicine too quickly may cause serious  side effects or your condition may worsen. A special MedGuide will be given to you by the pharmacist with each prescription and refill. Be sure to read this information carefully each time. Talk to your pediatrician regarding the use of this medicine in children. Special care may be needed. Overdosage: If you think you have taken too much of this medicine contact a poison control center or emergency room at once. NOTE: This medicine is only for you. Do not share this medicine with others. What if I miss a dose? If you miss a dose, take it as  soon as you can. If it is almost time for your next dose, take only that dose. Do not take double or extra doses. What may interact with this medicine? Do not take this medicine with any of the following medications:  arsenic trioxide  certain medicines used to regulate abnormal heartbeat or to treat other heart conditions  cisapride  droperidol  halofantrine  linezolid  MAOIs like Carbex, Eldepryl, Marplan, Nardil, and Parnate  methylene blue  other medicines for mental depression  phenothiazines like perphenazine, thioridazine and chlorpromazine  pimozide  probucol  procarbazine  sparfloxacin  St. John's Wort This medicine may also interact with the following medications:  atropine and related drugs like hyoscyamine, scopolamine, tolterodine and others  barbiturate medicines for inducing sleep or treating seizures, like phenobarbital  cimetidine  disulfiram  ethchlorvynol  thyroid hormones such as levothyroxine  ziprasidone This list may not describe all possible interactions. Give your health care provider a list of all the medicines, herbs, non-prescription drugs, or dietary supplements you use. Also tell them if you smoke, drink alcohol, or use illegal drugs. Some items may interact with your medicine. What should I watch for while using this medicine? Tell your doctor if your symptoms do not get better or if they get  worse. Visit your doctor or health care professional for regular checks on your progress. Because it may take several weeks to see the full effects of this medicine, it is important to continue your treatment as prescribed by your doctor. Patients and their families should watch out for new or worsening thoughts of suicide or depression. Also watch out for sudden changes in feelings such as feeling anxious, agitated, panicky, irritable, hostile, aggressive, impulsive, severely restless, overly excited and hyperactive, or not being able to sleep. If this happens, especially at the beginning of treatment or after a change in dose, call your health care professional. Dennis Bast may get drowsy or dizzy. Do not drive, use machinery, or do anything that needs mental alertness until you know how this medicine affects you. Do not stand or sit up quickly, especially if you are an older patient. This reduces the risk of dizzy or fainting spells. Alcohol may interfere with the effect of this medicine. Avoid alcoholic drinks. Do not treat yourself for coughs, colds, or allergies without asking your doctor or health care professional for advice. Some ingredients can increase possible side effects. Your mouth may get dry. Chewing sugarless gum or sucking hard candy, and drinking plenty of water will help. Contact your doctor if the problem does not go away or is severe. This medicine may cause dry eyes and blurred vision. If you wear contact lenses you may feel some discomfort. Lubricating drops may help. See your eye doctor if the problem does not go away or is severe. This medicine can cause constipation. Try to have a bowel movement at least every 2 to 3 days. If you do not have a bowel movement for 3 days, call your doctor or health care professional. This medicine can make you more sensitive to the sun. Keep out of the sun. If you cannot avoid being in the sun, wear protective clothing and use sunscreen. Do not use sun lamps  or tanning beds/booths. What side effects may I notice from receiving this medicine? Side effects that you should report to your doctor or health care professional as soon as possible:  allergic reactions like skin rash, itching or hives, swelling of the face, lips, or tongue  anxious  breathing problems  changes in vision  confusion  elevated mood, decreased need for sleep, racing thoughts, impulsive behavior  eye pain  fast, irregular heartbeat  feeling faint or lightheaded, falls  feeling agitated, angry, or irritable  fever with increased sweating  hallucination, loss of contact with reality  seizures  stiff muscles  suicidal thoughts or other mood changes  tingling, pain, or numbness in the feet or hands  trouble passing urine or change in the amount of urine  trouble sleeping  unusually weak or tired  vomiting  yellowing of the eyes or skin Side effects that usually do not require medical attention (report to your doctor or health care professional if they continue or are bothersome):  change in sex drive or performance  change in appetite or weight  constipation  dizziness  dry mouth  nausea  tired  tremors  upset stomach This list may not describe all possible side effects. Call your doctor for medical advice about side effects. You may report side effects to FDA at 1-800-FDA-1088. Where should I keep my medicine? Keep out of the reach of children. Store at room temperature between 20 and 25 degrees C (68 and 77 degrees F). Throw away any unused medicine after the expiration date. NOTE: This sheet is a summary. It may not cover all possible information. If you have questions about this medicine, talk to your doctor, pharmacist, or health care provider.  2020 Elsevier/Gold Standard (2018-01-09 13:04:32)  Elsevier Patient Education  [employer].

## 2019-02-28 NOTE — Progress Notes (Signed)
PATIENT: Tina Haney DOB: [birth year]  REASON FOR VISIT: follow up HISTORY FROM: patient  Chief Complaint  Patient presents with  . Follow-up    4 mon f/u. Alone. Rm 7. Patient mentioned that at the end of Decemeber she had a 9 day migraine. She stated that she is having daily headaches. She would like to discuss possibly doing Botox injections.      HISTORY OF PRESENT ILLNESS: Today 02/28/19 Tina Haney is a 39 y.o. female here today for follow up for migraines. She was started on Amovig 70mg  monthly in 10/2018. She does feel that headaches improved. She reports that intensity in somewhat better. She continues to have daily headaches. She has light and sound sensitivity as well as nausea 15-20 days a month. She recently had a migraine that lasted 9 days. She came in for an infusion that helped somewhat. Nurtec may help a little. Dry needling is beneficial.   She is interested in Botox therapy. She has had Botox therapy for cosmetic purposes in the past and tolerated well. She has had nerve blocks in the past that were not beneficial.   She is in graduate school. She has 4 children. She is seen by psychiatry for anxiety, depression, ADHD (on Concerta), PTSD and trauma counseling. She is s/p cervical surgery. She was diagnosed with Hashimoto's disease [place] but reports that recent thyroid levels have been normal. She is not treated for autoimmune disease at this time. She has a hard time gaining weight. She can't take topiramate due to weight loss, propranolol caused fatigue and depression, duloxetine was started for fibromyalgia but stopped due to nausea.      HISTORY: (copied from Dr Gladstone Lighter note on 10/29/2018)  UPDATE (10/29/18, VRP): 39 year old female with headaches. Since last visit, here for new consult re: headaches and migraine. Has had tension HA in the past for many years (dull, band pressure). In last few years, having intermittent HA, with photophobia, severe. In  August 2020 had right sided headache, vision blurred, funhouse mirror vision, left hand tingling. Using almost daily fioricet for daily headaches. Family history of migraine in father and sister. Still with brain fog, muscle spasms, fatigue.   PRIOR HPI (04/25/17): 39 year old female here for evaluation of muscle twitching, muscle spasm, fatigue, weakness, headaches, brain fog, eye twitching, blurred vision, since 2012.  2010 patient moved to [place] with her family, was involved in [employer] work there, had significant stress factors.  By 2012 patient had lost weight, had GI symptoms with abdominal pain, and was evaluated by specialist.  She was diagnosed with vitamin deficiencies and iron deficiencies.  These were treated.  Continue to progressively worsen.  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  She was recommended to treat these conservatively.  Patient has had a vegan diet since age 88 up to 39 years old.  She has taken B12 supplements in the past but was found to be low in 2012.  Recent B12 levels have been normal.  For past 2 years patient has been eating some meat products in her diet.  Patient had MRI of the brain yesterday which we reviewed together.  Patient has had positive ANA in the past and seen rheumatologist in [place].  She is also planning to see a rheumatologist in [place] soon.  Patient has had significant depression, anxiety, PTSD, chronic stress syndrome diagnoses in the past.  She feels like these are improved compared to previously.    REVIEW OF SYSTEMS: Out of a complete 14 system review of symptoms, the patient complains only of the following symptoms, headaches, anxiety, depression, neck pain and all other reviewed systems are negative.  ALLERGIES: Allergies  Allergen Reactions  . Gluten Meal Other (See Comments)    Joint problems   . Aleve [Naproxen Sodium] Rash  . Percocet [Oxycodone-Acetaminophen] Rash    Itching, throat closing    HOME MEDICATIONS: Outpatient Medications Prior to Visit  Medication Sig Dispense Refill  . Artificial Tear Ointment (DRY EYES OP) Place 1 drop into both eyes 3 (three) times daily as needed (for dry eyes).    . CALCIUM PO Take 1 tablet by mouth daily.     . CONCERTA 36 MG CR tablet Take 36 mg by mouth 2 (two) times daily.   0  . Multiple Vitamins-Minerals (MULTIVITAMIN PO) Take 1 capsule by mouth 3 (three) times daily.    . Omega-3 Fatty Acids (FISH OIL PO) Take 1 capsule by mouth 3 (three) times daily.    . rizatriptan (MAXALT-MLT) 10 MG disintegrating tablet Take 1 tablet (10 mg total) by mouth as needed for migraine. May repeat in 2 hours if needed 9 tablet 11  . UNABLE TO FIND Med Name: Optiferin-C 100 mg vit C, 11 mg calcium, Iron 28 mg    . valACYclovir (VALTREX) 1000 MG tablet Take 1,000 mg by mouth daily.    Marland Kitchen VITAMIN D PO Take 1 tablet by mouth daily.    Dorise Hiss (AIMOVIG) 70 MG/ML SOAJ Inject 70 mg into the skin every 30 (thirty) days. 3 pen 4  . Rimegepant Sulfate (NURTEC) 75 MG TBDP Take 75 mg by mouth daily as needed. 8 tablet 6  . Butalbital-APAP-Caffeine 50-300-40 MG CAPS TK 1 C PO Q 4 H PRN    . DULoxetine (CYMBALTA) 60 MG capsule 60 mg daily.    . norethindrone-ethinyl estradiol (JUNEL FE,GILDESS FE,LOESTRIN FE) 1-20 MG-MCG tablet Take 1 tablet by mouth daily.     No facility-administered medications prior to visit.    PAST MEDICAL HISTORY: Past Medical History:  Diagnosis Date  . Acute Epstein Barr virus (EBV) infection   . Anemia   . Arthritis   . Chronic fatigue   . Chronic posttraumatic stress syndrome   . Concussion    09/2015,  Was 2nd or 3rd concussion.   . Esophagitis   . GERD (gastroesophageal reflux disease)   . Headache   . Herniated disc, cervical    C5/6 and 6/7   . Migraines   . Murmur, heart    as a child   . Paresthesia   . Polyp  of colon   . PTSD (post-traumatic stress disorder)   . Thyroid nodule     PAST SURGICAL HISTORY: Past Surgical History:  Procedure Laterality Date  . ANTERIOR CERVICAL DECOMP/DISCECTOMY FUSION N/A 11/30/2017   Procedure: ANTERIOR CERVICAL DECOMPRESSION/DISCECTOMY FUSION CERVICAL 5-7;  Surgeon: Venita Lick, MD;  Location: [facility] OR;  Service: Orthopedics;  Laterality: N/A;  3.5 hrs  . COLONOSCOPY     Done in [place], 2 polyps, benign  . DENTAL SURGERY    . UPPER GI ENDOSCOPY     recent EGD done in [place] by Dr. Uvaldo Rising    FAMILY HISTORY: Family History  Problem Relation Age of Onset  . Bipolar disorder Mother   . ADD / ADHD Mother   . Celiac disease Daughter   . Cancer Maternal  Grandmother        breast  . Alzheimer's disease Maternal Grandmother   . Kidney disease Maternal Grandfather   . Migraines Father   . Dementia Father   . Skin cancer Father   . Eczema Son   . ADD / ADHD Son   . ADD / ADHD Daughter     SOCIAL HISTORY: Social History   Socioeconomic History  . Marital status: Married    Spouse name: Bernette Redbird  . Number of children: 4  . Years of education: Not on file  . Highest education level: Not on file  Occupational History    Comment: na  Tobacco Use  . Smoking status: Never Smoker  . Smokeless tobacco: Never Used  Substance and Sexual Activity  . Alcohol use: No  . Drug use: No  . Sexual activity: Yes    Partners: Male    Birth control/protection: Surgical, Pill    Comment: husband vastectomy  Other Topics Concern  . Not on file  Social History Narrative   Born in [place]. Grew up in [place] and [place]. Married with four children. Married for 15 years-12, 11, 10, 7. Attend school at [employer].   Education: BS some masters work.  Work: just had to resign.  Caffiene: one cup of tea / day.    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   [place].    Came back to the [place] in 07/2016.     Seen by Functional medicine doctor and had lots of labs done and put on lots of supplement.    Social Determinants of Health   Financial Resource Strain:   . Difficulty of Paying Living Expenses: Not on file  Food Insecurity:   . Worried About Programme researcher, broadcasting/film/video in the Last Year: Not on file  . Ran Out of Food in the Last Year: Not on file  Transportation Needs:   . Lack of Transportation (Medical): Not on file  . Lack of Transportation (Non-Medical): Not on file  Physical Activity:   . Days of Exercise per Week: Not on file  . Minutes of Exercise per Session: Not on file  Stress:   . Feeling of Stress : Not on file  Social Connections:   . Frequency of Communication with Friends and Family: Not on file  . Frequency of Social Gatherings with Friends and Family: Not on file  . Attends Religious Services: Not on file  . Active Member of Clubs or Organizations: Not on file  . Attends Banker Meetings: Not on file  . Marital Status: Not on file  Intimate Partner Violence:   . Fear of Current or Ex-Partner: Not on file  . Emotionally Abused: Not on file  . Physically Abused: Not on file  . Sexually Abused: Not on file      PHYSICAL EXAM  Vitals:   02/28/19 0852  BP: 111/79  Pulse: (!) 112  Temp: (!) 97.4 F (36.3 C)  TempSrc: Oral  Weight: 116 lb (52.6 kg)  Height: 5\' 4"  (1.626 m)   Body mass index is 19.91 kg/m.  Generalized: Well developed, in no acute distress  Cardiology: normal rate and rhythm, no murmur noted Respiratory: clear to auscultation bilaterally  Neurological examination  Mentation: Alert oriented to time, place, history taking. Follows all commands speech and language fluent Cranial nerve II-XII: Pupils were equal round reactive to light. Extraocular movements were full, visual field were  full on confrontational test. Facial sensation and strength were normal. Head turning and shoulder shrug  were normal and symmetric. Motor: The motor  testing reveals 5 over 5 strength of all 4 extremities. Good symmetric motor tone is noted throughout.  Sensory: Sensory testing is intact to soft touch on all 4 extremities. No evidence of extinction is noted.  Coordination: Cerebellar testing reveals good finger-nose-finger and heel-to-shin bilaterally.  Gait and station: Gait is normal.   DIAGNOSTIC DATA (LABS, IMAGING, TESTING) - I reviewed patient records, labs, notes, testing and imaging myself where available.  No flowsheet data found.   Lab Results  Component Value Date   WBC 5.2 11/21/2017   HGB 12.4 11/21/2017   HCT 39.5 11/21/2017   MCV 88.0 11/21/2017   PLT 365 11/21/2017      Component Value Date/Time   NA 136 04/03/2017 1312   K 4.1 04/03/2017 1312   CL 100 (L) 04/03/2017 1312   CO2 26 04/03/2017 1312   GLUCOSE 104 (H) 04/03/2017 1312   BUN 11 04/03/2017 1312   CREATININE 0.87 04/03/2017 1312   CALCIUM 9.4 04/03/2017 1312   PROT 6.7 04/03/2017 1312   ALBUMIN 4.1 04/03/2017 1312   AST 19 04/03/2017 1312   ALT 17 04/03/2017 1312   ALKPHOS 63 04/03/2017 1312   BILITOT 0.4 04/03/2017 1312   GFRNONAA >60 04/03/2017 1312   GFRAA >60 04/03/2017 1312   No results found for: CHOL, HDL, LDLCALC, LDLDIRECT, TRIG, CHOLHDL No results found for: [id number] Lab Results  Component Value Date   VITAMINB12 951 04/13/2017   Lab Results  Component Value Date   TSH 0.96 05/25/2017    ASSESSMENT AND PLAN 39 y.o. year old female  has a past medical history of Acute Epstein Barr virus (EBV) infection, Anemia, Arthritis, Chronic fatigue, Chronic posttraumatic stress syndrome, Concussion, Esophagitis, GERD (gastroesophageal reflux disease), Headache, Herniated disc, cervical, Migraines, Murmur, heart, Paresthesia, Polyp of colon, PTSD (post-traumatic stress disorder), and Thyroid nodule. here with     ICD-10-CM   1. Migraine with aura and without status migrainosus, not intractable  G43.109     Tina Haney continues to have daily  headaches with > 15 migrainous days each month. Amovig helped to decrease intensity but headaches persist and are debilitating. She is requesting to try Botox. We have reviewed headache protocol, possible side effects including possible serious/hypersensitivity reactions. I have reviewed the Botox consent form and patient has signed acknowledging understanding. We will stop Amovig. She will continue Nurtec for now. She will focus on drinking plenty of water and eating a well balanced diet. I fear that multiple psychiatric factors contribute to tension headaches and migraines. She will continue close follow up with psychiatry and PCP. She will follow up with me in 3 months, sooner if needed.      No orders of the defined types were placed in this encounter.    Meds ordered this encounter  Medications  . DISCONTD: Dorise HissErenumab-aooe 140 MG/ML SOAJ    Sig: Inject 140 mg into the skin every 30 (thirty) days.    Dispense:  3 pen    Refill:  3    Order Specific Question:   Supervising Provider    Answer:   Anson FretAHERN, ANTONIA B [id number]]  . Rimegepant Sulfate (NURTEC) 75 MG TBDP    Sig: Take 75 mg by mouth daily as needed.    Dispense:  8 tablet    Refill:  6    Order Specific Question:   Supervising Provider  AnswerAnson Fret [[id number]]      I spent 30 minutes with the patient. 50% of this time was spent counseling and educating patient on plan of care and medications.     Shawnie Dapper, FNP-C 02/28/2019, 10:00 AM [facility] [address], [address] [place], [place] [address] [phone]

## 2019-02-28 NOTE — Telephone Encounter (Signed)
Patient is interested in starting Botox. Amovig discontinued today. She has failed propranolol and duloxetine in the past. Can't take topiramate due to weight loss. Currently taking Nurtec for abortive therapy. Tried and failed rizatriptan.

## 2019-02-28 NOTE — Telephone Encounter (Signed)
She receive letter.

## 2019-03-01 ENCOUNTER — Encounter: Payer: Self-pay | Admitting: Family Medicine

## 2019-03-04 DIAGNOSIS — F4323 Adjustment disorder with mixed anxiety and depressed mood: Secondary | ICD-10-CM | POA: Diagnosis not present

## 2019-03-04 NOTE — Progress Notes (Signed)
I reviewed note and agree with plan.   VIKRAM R. PENUMALLI, MD 03/04/2019, 9:53 AM Certified in Neurology, Neurophysiology and Neuroimaging  [facility] [address], [address] [place], [place] [address] [phone]  

## 2019-03-05 MED ORDER — ONABOTULINUMTOXINA 100 UNITS IJ SOLR: 200.0000 [IU] | INTRAMUSCULAR | Status: DC

## 2019-03-05 MED ORDER — BOTOX 100 UNITS IJ SOLR: INTRAMUSCULAR | 3 refills | Status: DC

## 2019-03-05 NOTE — Telephone Encounter (Signed)
I placed the order for BOTOX [facility].

## 2019-03-05 NOTE — Addendum Note (Signed)
Addended by: Guy Begin on: 03/05/2019 01:58 PM   Modules accepted: Orders

## 2019-03-05 NOTE — Telephone Encounter (Signed)
I called and scheduled the patient.  Would you send a script to [facility]?

## 2019-03-06 ENCOUNTER — Telehealth: Payer: Self-pay | Admitting: *Deleted

## 2019-03-06 NOTE — Telephone Encounter (Signed)
Spoke to [employer], at [facility].  Clarified Botox quantity 2 of 100u vials.  3 refills Verbal order to [facility].

## 2019-03-13 ENCOUNTER — Telehealth: Payer: Self-pay | Admitting: Family Medicine

## 2019-03-13 NOTE — Telephone Encounter (Signed)
Tina Haney from [facility] called with a number for the P.A. to be called in on and he states that it will need to be filed under Medical P.A. [phone]

## 2019-03-20 NOTE — Telephone Encounter (Signed)
Viviann Spare from [facility] re: the PA for pt's Botox. He said they will close the order until PA has been completed, he left their # as [phone]

## 2019-03-21 ENCOUNTER — Encounter: Payer: Self-pay | Admitting: Family Medicine

## 2019-03-27 NOTE — Telephone Encounter (Signed)
PA Botox [id number] (09/22/19). DWD

## 2019-03-27 NOTE — Telephone Encounter (Signed)
Late Entry; I called yesterday and started the process to make them aware the script needed to be started back, they said it would be 24-72 business hours. DWD

## 2019-03-28 DIAGNOSIS — F411 Generalized anxiety disorder: Secondary | ICD-10-CM | POA: Diagnosis not present

## 2019-03-28 DIAGNOSIS — F4310 Post-traumatic stress disorder, unspecified: Secondary | ICD-10-CM | POA: Diagnosis not present

## 2019-03-28 NOTE — Telephone Encounter (Signed)
I called to check status of the medication, it is still pending. DWD

## 2019-03-28 NOTE — Telephone Encounter (Signed)
I called to check status, they stated that the medication had been processed with a 0 dollar copay and they would be ready to schedule soon! I called the patient to make her aware that they would be reaching out for consent. DWD

## 2019-04-01 NOTE — Telephone Encounter (Signed)
Pt called office today and left a voicemail asking for a call back to discuss her botox appointment for tomorrow. She was told by her pharmacy that the botox will not be in the office tomorrow for her appointment and she wants to discuss this further.

## 2019-04-02 ENCOUNTER — Ambulatory Visit (INDEPENDENT_AMBULATORY_CARE_PROVIDER_SITE_OTHER): Payer: BC Managed Care – PPO | Admitting: Family Medicine

## 2019-04-02 ENCOUNTER — Encounter: Payer: Self-pay | Admitting: Family Medicine

## 2019-04-02 ENCOUNTER — Other Ambulatory Visit: Payer: Self-pay

## 2019-04-02 DIAGNOSIS — G43709 Chronic migraine without aura, not intractable, without status migrainosus: Secondary | ICD-10-CM

## 2019-04-02 DIAGNOSIS — IMO0002 Reserved for concepts with insufficient information to code with codable children: Secondary | ICD-10-CM

## 2019-04-02 NOTE — Progress Notes (Signed)
This is patient's first Botox procedure for headaches. Previously had Botox for cosmetic purposes. She continues daily headaches with 15-20 migraine days per month. Nurtec may help at times.   Consent Form Botulism Toxin Injection For Chronic Migraine    Reviewed orally with patient, additionally signature is on file:  Botulism toxin has been approved by the Federal drug administration for treatment of chronic migraine. Botulism toxin does not cure chronic migraine and it may not be effective in some patients.  The administration of botulism toxin is accomplished by injecting a small amount of toxin into the muscles of the neck and head. Dosage must be titrated for each individual. Any benefits resulting from botulism toxin tend to wear off after 3 months with a repeat injection required if benefit is to be maintained. Injections are usually done every 3-4 months with maximum effect peak achieved by about 2 or 3 weeks. Botulism toxin is expensive and you should be sure of what costs you will incur resulting from the injection.  The side effects of botulism toxin use for chronic migraine may include:   -Transient, and usually mild, facial weakness with facial injections  -Transient, and usually mild, head or neck weakness with head/neck injections  -Reduction or loss of forehead facial animation due to forehead muscle weakness  -Eyelid drooping  -Dry eye  -Pain at the site of injection or bruising at the site of injection  -Double vision  -Potential unknown long term risks   Contraindications: You should not have Botox if you are pregnant, nursing, allergic to albumin, have an infection, skin condition, or muscle weakness at the site of the injection, or have myasthenia gravis, Lambert-Eaton syndrome, or ALS.  It is also possible that as with any injection, there may be an allergic reaction or no effect from the medication. Reduced effectiveness after repeated injections is sometimes  seen and rarely infection at the injection site may occur. All care will be taken to prevent these side effects. If therapy is given over a long time, atrophy and wasting in the muscle injected may occur. Occasionally the patient's become refractory to treatment because they develop antibodies to the toxin. In this event, therapy needs to be modified.  I have read the above information and consent to the administration of botulism toxin.    BOTOX PROCEDURE NOTE FOR MIGRAINE HEADACHE  Contraindications and precautions discussed with patient(above). Aseptic procedure was observed and patient tolerated procedure. Procedure performed by Shawnie Dapper, FNP-C.   The condition has existed for more than 6 months, and pt does not have a diagnosis of ALS, Myasthenia Gravis or Lambert-Eaton Syndrome.  Risks and benefits of injections discussed and pt agrees to proceed with the procedure.  Written consent obtained  These injections are medically necessary. Pt  receives good benefits from these injections. These injections do not cause sedations or hallucinations which the oral therapies may cause.   Description of procedure:  The patient was placed in a sitting position. The standard protocol was used for Botox as follows, with 5 units of Botox injected at each site:  -Procerus muscle, midline injection  -Corrugator muscle, bilateral injection  -Frontalis muscle, bilateral injection, with 2 sites each side, medial injection was performed in the upper one third of the frontalis muscle, in the region vertical from the medial inferior edge of the superior orbital rim. The lateral injection was again in the upper one third of the forehead vertically above the lateral limbus of the cornea, 1.5 cm lateral  to the medial injection site.  -Temporalis muscle injection, 4 sites, bilaterally. The first injection was 3 cm above the tragus of the ear, second injection site was 1.5 cm to 3 cm up from the first injection  site in line with the tragus of the ear. The third injection site was 1.5-3 cm forward between the first 2 injection sites. The fourth injection site was 1.5 cm posterior to the second injection site. 5th site laterally in the temporalis  muscleat the level of the outer canthus.  -Occipitalis muscle injection, 3 sites, bilaterally. The first injection was done one half way between the occipital protuberance and the tip of the mastoid process behind the ear. The second injection site was done lateral and superior to the first, 1 fingerbreadth from the first injection. The third injection site was 1 fingerbreadth superiorly and medially from the first injection site.  -Cervical paraspinal muscle injection, 2 sites, bilateral knee first injection site was 1 cm from the midline of the cervical spine, 3 cm inferior to the lower border of the occipital protuberance. The second injection site was 1.5 cm superiorly and laterally to the first injection site.  -Trapezius muscle injection was performed at 3 sites, bilaterally. The first injection site was in the upper trapezius muscle halfway between the inflection point of the neck, and the acromion. The second injection site was one half way between the acromion and the first injection site. The third injection was done between the first injection site and the inflection point of the neck.   Will return for repeat injection in 3 months.   A total of 200 units of Botox was prepared, 155 units of Botox was injected as documented above, any Botox not injected was wasted. The patient tolerated the procedure well, there were no complications of the above procedure.

## 2019-04-02 NOTE — Progress Notes (Signed)
Botox- 200 units x 4 vials Lot: [id number] Expiration: 07/2021 NDC: 1443-1540-08  Bacteriostatic 0.9% Sodium Chloride- 77mL total Lot: [id number] Expiration: 05/02/2019 NDC: 0932-6712-45  Dx: Y09.983 S/P

## 2019-04-03 NOTE — Telephone Encounter (Signed)
I spoke to this patient on Monday 04/01/19. DWD

## 2019-04-09 DIAGNOSIS — A049 Bacterial intestinal infection, unspecified: Secondary | ICD-10-CM | POA: Diagnosis not present

## 2019-04-09 DIAGNOSIS — K900 Celiac disease: Secondary | ICD-10-CM | POA: Diagnosis not present

## 2019-04-09 DIAGNOSIS — R790 Abnormal level of blood mineral: Secondary | ICD-10-CM | POA: Diagnosis not present

## 2019-04-09 DIAGNOSIS — R5382 Chronic fatigue, unspecified: Secondary | ICD-10-CM | POA: Diagnosis not present

## 2019-04-24 NOTE — Progress Notes (Signed)
I reviewed note and agree with plan.   Giannamarie Paulus R. Stefana Lodico, MD 04/24/2019, 8:35 AM Certified in Neurology, Neurophysiology and Neuroimaging  [facility] [address], [address] [place], [place] [address] [phone]  

## 2019-05-01 DIAGNOSIS — G43109 Migraine with aura, not intractable, without status migrainosus: Secondary | ICD-10-CM | POA: Diagnosis not present

## 2019-05-03 DIAGNOSIS — A049 Bacterial intestinal infection, unspecified: Secondary | ICD-10-CM | POA: Diagnosis not present

## 2019-05-03 DIAGNOSIS — D899 Disorder involving the immune mechanism, unspecified: Secondary | ICD-10-CM | POA: Diagnosis not present

## 2019-05-03 DIAGNOSIS — K900 Celiac disease: Secondary | ICD-10-CM | POA: Diagnosis not present

## 2019-05-03 DIAGNOSIS — R790 Abnormal level of blood mineral: Secondary | ICD-10-CM | POA: Diagnosis not present

## 2019-05-03 DIAGNOSIS — R5382 Chronic fatigue, unspecified: Secondary | ICD-10-CM | POA: Diagnosis not present

## 2019-05-07 ENCOUNTER — Encounter: Payer: Self-pay | Admitting: *Deleted

## 2019-05-07 DIAGNOSIS — F411 Generalized anxiety disorder: Secondary | ICD-10-CM | POA: Diagnosis not present

## 2019-05-07 DIAGNOSIS — F4310 Post-traumatic stress disorder, unspecified: Secondary | ICD-10-CM | POA: Diagnosis not present

## 2019-05-27 ENCOUNTER — Encounter: Payer: Self-pay | Admitting: Family Medicine

## 2019-05-29 NOTE — Progress Notes (Signed)
PATIENT: Tina Haney DOB: 1980-09-29  REASON FOR VISIT: follow up HISTORY FROM: patient  Chief Complaint  Patient presents with  . Follow-up    Migraine, rm 1 , alone     HISTORY OF PRESENT ILLNESS: Today 05/30/19 Tina Haney is a 39 y.o. female here today for follow up for migraines. First Botox procedure was in 04/2019.  She has not noted significant improvement in her headache frequency or intensity.  We have discussed adding additional prevention medications at this time visit to do so.  She uses rizatriptan as needed for abortive therapy but does not feel that it is effective.  We called in Nurtec at last follow-up but this medication was denied by her insurance. She continues to have multiple medical concerns.  She reports that muscle twitching continues.  This is random and at various locations over her body.  She continues to have difficulty with inattention and anxiety.  She was started on gabapentin by psychiatry. She does feel that it has helped with sleep.  She has also been seen by a functional medicine provider who performed a series of labs.  She has follow-up tomorrow to review these labs.  HISTORY: (copied from my note on 02/28/2019)  Tina Haney is a 39 y.o. female here today for follow up for migraines. She was started on Amovig 70mg  monthly in 10/2018. She does feel that headaches improved. She reports that intensity in somewhat better. She continues to have daily headaches. She has light and sound sensitivity as well as nausea 15-20 days a month. She recently had a migraine that lasted 9 days. She came in for an infusion that helped somewhat. Nurtec may help a little. Dry needling is beneficial.   She is interested in Botox therapy. She has had Botox therapy for cosmetic purposes in the past and tolerated well. She has had nerve blocks in the past that were not beneficial.   She is in graduate school. She has 4 children. She is seen by psychiatry for anxiety,  depression, ADHD (on Concerta), PTSD and trauma counseling. She is s/p cervical surgery. She was diagnosed with Hashimoto's disease [place] but reports that recent thyroid levels have been normal. She is not treated for autoimmune disease at this time. She has a hard time gaining weight. She can't take topiramate due to weight loss, propranolol caused fatigue and depression, duloxetine was started for fibromyalgia but stopped due to nausea.     REVIEW OF SYSTEMS: Out of a complete 14 system review of symptoms, the patient complains only of the following symptoms, headaches, muscle twitching, anxiety, inattention, brain fog and all other reviewed systems are negative.   ALLERGIES: Allergies  Allergen Reactions  . Gluten Meal Other (See Comments)    Joint problems  . Aleve [Naproxen Sodium] Rash  . Percocet [Oxycodone-Acetaminophen] Rash    Itching, throat closing    HOME MEDICATIONS: Outpatient Medications Prior to Visit  Medication Sig Dispense Refill  . Artificial Tear Ointment (DRY EYES OP) Place 1 drop into both eyes 3 (three) times daily as needed (for dry eyes).    . botulinum toxin Type A (BOTOX) 100 units SOLR injection Inject 155 units IM into head and neck every 3 months by the provider. 200 Units 3  . CALCIUM PO Take 1 tablet by mouth daily.     11/2018 gabapentin (NEURONTIN) 300 MG capsule Take 300 mg by mouth 3 (three) times daily.    . methylphenidate 54 MG PO CR tablet Take 54  mg by mouth every morning.    . Multiple Vitamins-Minerals (MULTIVITAMIN PO) Take 1 capsule by mouth 3 (three) times daily.    . Omega-3 Fatty Acids (FISH OIL PO) Take 1 capsule by mouth 3 (three) times daily.    . Rimegepant Sulfate (NURTEC) 75 MG TBDP Take 75 mg by mouth daily as needed. 8 tablet 6  . UNABLE TO FIND Med Name: Optiferin-C 100 mg vit C, 11 mg calcium, Iron 28 mg    . valACYclovir (VALTREX) 1000 MG tablet Take 1,000 mg by mouth daily.    Marland Kitchen VITAMIN D PO Take 1 tablet by mouth daily.     No  facility-administered medications prior to visit.    PAST MEDICAL HISTORY: Past Medical History:  Diagnosis Date  . Acute Epstein Barr virus (EBV) infection   . Anemia   . Arthritis   . Chronic fatigue   . Chronic posttraumatic stress syndrome   . Concussion    09/2015,  Was 2nd or 3rd concussion.   . Esophagitis   . GERD (gastroesophageal reflux disease)   . Headache   . Herniated disc, cervical    C5/6 and 6/7   . Migraines   . Murmur, heart    as a child   . Paresthesia   . Polyp of colon   . PTSD (post-traumatic stress disorder)   . Thyroid nodule     PAST SURGICAL HISTORY: Past Surgical History:  Procedure Laterality Date  . ANTERIOR CERVICAL DECOMP/DISCECTOMY FUSION N/A 11/30/2017   Procedure: ANTERIOR CERVICAL DECOMPRESSION/DISCECTOMY FUSION CERVICAL 5-7;  Surgeon: Venita Lick, MD;  Location: [facility] OR;  Service: Orthopedics;  Laterality: N/A;  3.5 hrs  . COLONOSCOPY     Done in [place], 2 polyps, benign  . DENTAL SURGERY    . UPPER GI ENDOSCOPY     recent EGD done in [place] by Dr. Uvaldo Rising    FAMILY HISTORY: Family History  Problem Relation Age of Onset  . Bipolar disorder Mother   . ADD / ADHD Mother   . Celiac disease Daughter   . Cancer Maternal Grandmother        breast  . Alzheimer's disease Maternal Grandmother   . Kidney disease Maternal Grandfather   . Migraines Father   . Dementia Father   . Skin cancer Father   . Eczema Son   . ADD / ADHD Son   . ADD / ADHD Daughter     SOCIAL HISTORY: Social History   Socioeconomic History  . Marital status: Married    Spouse name: Bernette Redbird  . Number of children: 4  . Years of education: Not on file  . Highest education level: Not on file  Occupational History    Comment: na  Tobacco Use  . Smoking status: Never Smoker  . Smokeless tobacco: Never Used  Substance and Sexual Activity  . Alcohol use: No  . Drug use: No  . Sexual activity: Yes    Partners: Male    Birth control/protection:  Surgical, Pill    Comment: husband vastectomy  Other Topics Concern  . Not on file  Social History Narrative   Born in [place]. Grew up in [place] and [place]. Married with four children. Married for 15 years-12, 11, 10, 7. Attend school at [employer].   Education: BS some masters work.  Work: just had to resign.  Caffiene: one cup of tea / day.    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  Renato Gails in [place], husband.    Came back to the [place] in 07/2016.    Seen by Functional medicine doctor and had lots of labs done and put on lots of supplement.    Social Determinants of Health   Financial Resource Strain:   . Difficulty of Paying Living Expenses:   Food Insecurity:   . Worried About Programme researcher, broadcasting/film/video in the Last Year:   . Barista in the Last Year:   Transportation Needs:   . Freight forwarder (Medical):   Marland Kitchen Lack of Transportation (Non-Medical):   Physical Activity:   . Days of Exercise per Week:   . Minutes of Exercise per Session:   Stress:   . Feeling of Stress :   Social Connections:   . Frequency of Communication with Friends and Family:   . Frequency of Social Gatherings with Friends and Family:   . Attends Religious Services:   . Active Member of Clubs or Organizations:   . Attends Banker Meetings:   Marland Kitchen Marital Status:   Intimate Partner Violence:   . Fear of Current or Ex-Partner:   . Emotionally Abused:   Marland Kitchen Physically Abused:   . Sexually Abused:       PHYSICAL EXAM  Vitals:   05/30/19 0902  BP: 111/73  Pulse: 80  Temp: 97.6 F (36.4 C)  Weight: 110 lb (49.9 kg)  Height: 5\' 4"  (1.626 m)   Body mass index is 18.88 kg/m.  Generalized: Well developed, in no acute distress  Cardiology: normal rate and rhythm, no murmur noted Respiratory: clear to auscultation bilaterally  Neurological examination  Mentation: Alert oriented to time, place, history taking. Follows all commands  speech and language fluent Cranial nerve II-XII: Pupils were equal round reactive to light. Extraocular movements were full, visual field were full  Motor: The motor testing reveals 5 over 5 strength of all 4 extremities. Good symmetric motor tone is noted throughout.  Gait and station: Gait is normal.    DIAGNOSTIC DATA (LABS, IMAGING, TESTING) - I reviewed patient records, labs, notes, testing and imaging myself where available.  No flowsheet data found.   Lab Results  Component Value Date   WBC 5.2 11/21/2017   HGB 12.4 11/21/2017   HCT 39.5 11/21/2017   MCV 88.0 11/21/2017   PLT 365 11/21/2017      Component Value Date/Time   NA 136 04/03/2017 1312   K 4.1 04/03/2017 1312   CL 100 (L) 04/03/2017 1312   CO2 26 04/03/2017 1312   GLUCOSE 104 (H) 04/03/2017 1312   BUN 11 04/03/2017 1312   CREATININE 0.87 04/03/2017 1312   CALCIUM 9.4 04/03/2017 1312   PROT 6.7 04/03/2017 1312   ALBUMIN 4.1 04/03/2017 1312   AST 19 04/03/2017 1312   ALT 17 04/03/2017 1312   ALKPHOS 63 04/03/2017 1312   BILITOT 0.4 04/03/2017 1312   GFRNONAA >60 04/03/2017 1312   GFRAA >60 04/03/2017 1312   No results found for: CHOL, HDL, LDLCALC, LDLDIRECT, TRIG, CHOLHDL No results found for: 06/03/2017 Lab Results  Component Value Date   VITAMINB12 951 04/13/2017   Lab Results  Component Value Date   TSH 0.96 05/25/2017       ASSESSMENT AND PLAN 39 y.o. year old female  has a past medical history of Acute Epstein Barr virus (EBV) infection, Anemia, Arthritis, Chronic fatigue, Chronic posttraumatic stress syndrome, Concussion, Esophagitis, GERD (gastroesophageal reflux disease), Headache, Herniated disc, cervical, Migraines, Murmur,  heart, Paresthesia, Polyp of colon, PTSD (post-traumatic stress disorder), and Thyroid nodule. here with     ICD-10-CM   1. Chronic migraine  G43.709     [place] continues to have regular headaches with frequent migrainous symptoms.  First round of Botox was tolerated  well, however, she has not noted any significant benefit.  She will return for her second Botox procedure on June 7.  We have discussed recommendation for 3 procedure visits to allow medication time to become effective.  I have also rediscussed with her possible oral preventative medications that we could add to her treatment regimen.  She is very hesitant to add any medications at this time.  She was recently started on gabapentin which seems to be helping with sleep.  She will continue to work closely with her psychiatry provider for adjustments as needed.  I have provided additional information on amitriptyline.  I anticipate starting 2 mg at bedtime should she choose this is a therapy.  Nurtec access card given today in the office.  She may use this at her pharmacy.  She will call me with any concerns.  She will follow-up in the office in 3 to 6 months pending response to next Botox treatment.  She verbalizes understanding and agreement with this plan.   No orders of the defined types were placed in this encounter.    No orders of the defined types were placed in this encounter.     I spent 30 minutes with the patient. 50% of this time was spent counseling and educating patient on plan of care and medications.    Debbora Presto, FNP-C 05/30/2019, 9:22 AM [facility] [address], [place], [place] [address] [phone]

## 2019-05-30 ENCOUNTER — Encounter: Payer: Self-pay | Admitting: Family Medicine

## 2019-05-30 ENCOUNTER — Other Ambulatory Visit: Payer: Self-pay

## 2019-05-30 ENCOUNTER — Ambulatory Visit: Payer: BC Managed Care – PPO | Admitting: Family Medicine

## 2019-05-30 VITALS — BP 111/73 | HR 80 | Temp 97.6°F | Ht 64.0 in | Wt 110.0 lb

## 2019-05-30 DIAGNOSIS — G43709 Chronic migraine without aura, not intractable, without status migrainosus: Secondary | ICD-10-CM | POA: Diagnosis not present

## 2019-05-30 DIAGNOSIS — IMO0002 Reserved for concepts with insufficient information to code with codable children: Secondary | ICD-10-CM

## 2019-05-30 NOTE — Patient Instructions (Signed)
We will continue Botox every three months. Next appt 6/7. We can consider amitriptyline as discussed previously. Please read about this below and consider starting 10mg  at bedtime. Continue close follow up with PCP and psychiatry.   We will resend Nurtec   Follow up in 3-6 months   Amitriptyline tablets What is this medicine? AMITRIPTYLINE (a mee TRIP ti leen) is used to treat depression. This medicine may be used for other purposes; ask your health care provider or pharmacist if you have questions. COMMON BRAND NAME(S): Elavil, Vanatrip What should I tell my health care provider before I take this medicine? They need to know if you have any of these conditions:  an alcohol problem  asthma, difficulty breathing  bipolar disorder or schizophrenia  difficulty passing urine, prostate trouble  glaucoma  heart disease or previous heart attack  liver disease  over active thyroid  seizures  thoughts or plans of suicide, a previous suicide attempt, or family history of suicide attempt  an unusual or allergic reaction to amitriptyline, other medicines, foods, dyes, or preservatives  pregnant or trying to get pregnant  breast-feeding How should I use this medicine? Take this medicine by mouth with a drink of water. Follow the directions on the prescription label. You can take the tablets with or without food. Take your medicine at regular intervals. Do not take it more often than directed. Do not stop taking this medicine suddenly except upon the advice of your doctor. Stopping this medicine too quickly may cause serious side effects or your condition may worsen. A special MedGuide will be given to you by the pharmacist with each prescription and refill. Be sure to read this information carefully each time. Talk to your pediatrician regarding the use of this medicine in children. Special care may be needed. Overdosage: If you think you have taken too much of this medicine contact a  poison control center or emergency room at once. NOTE: This medicine is only for you. Do not share this medicine with others. What if I miss a dose? If you miss a dose, take it as soon as you can. If it is almost time for your next dose, take only that dose. Do not take double or extra doses. What may interact with this medicine? Do not take this medicine with any of the following medications:  arsenic trioxide  certain medicines used to regulate abnormal heartbeat or to treat other heart conditions  cisapride  droperidol  halofantrine  linezolid  MAOIs like Carbex, Eldepryl, Marplan, Nardil, and Parnate  methylene blue  other medicines for mental depression  phenothiazines like perphenazine, thioridazine and chlorpromazine  pimozide  probucol  procarbazine  sparfloxacin  St. John's Wort This medicine may also interact with the following medications:  atropine and related drugs like hyoscyamine, scopolamine, tolterodine and others  barbiturate medicines for inducing sleep or treating seizures, like phenobarbital  cimetidine  disulfiram  ethchlorvynol  thyroid hormones such as levothyroxine  ziprasidone This list may not describe all possible interactions. Give your health care provider a list of all the medicines, herbs, non-prescription drugs, or dietary supplements you use. Also tell them if you smoke, drink alcohol, or use illegal drugs. Some items may interact with your medicine. What should I watch for while using this medicine? Tell your doctor if your symptoms do not get better or if they get worse. Visit your doctor or health care professional for regular checks on your progress. Because it may take several weeks to see the  full effects of this medicine, it is important to continue your treatment as prescribed by your doctor. Patients and their families should watch out for new or worsening thoughts of suicide or depression. Also watch out for sudden  changes in feelings such as feeling anxious, agitated, panicky, irritable, hostile, aggressive, impulsive, severely restless, overly excited and hyperactive, or not being able to sleep. If this happens, especially at the beginning of treatment or after a change in dose, call your health care professional. Dennis Bast may get drowsy or dizzy. Do not drive, use machinery, or do anything that needs mental alertness until you know how this medicine affects you. Do not stand or sit up quickly, especially if you are an older patient. This reduces the risk of dizzy or fainting spells. Alcohol may interfere with the effect of this medicine. Avoid alcoholic drinks. Do not treat yourself for coughs, colds, or allergies without asking your doctor or health care professional for advice. Some ingredients can increase possible side effects. Your mouth may get dry. Chewing sugarless gum or sucking hard candy, and drinking plenty of water will help. Contact your doctor if the problem does not go away or is severe. This medicine may cause dry eyes and blurred vision. If you wear contact lenses you may feel some discomfort. Lubricating drops may help. See your eye doctor if the problem does not go away or is severe. This medicine can cause constipation. Try to have a bowel movement at least every 2 to 3 days. If you do not have a bowel movement for 3 days, call your doctor or health care professional. This medicine can make you more sensitive to the sun. Keep out of the sun. If you cannot avoid being in the sun, wear protective clothing and use sunscreen. Do not use sun lamps or tanning beds/booths. What side effects may I notice from receiving this medicine? Side effects that you should report to your doctor or health care professional as soon as possible:  allergic reactions like skin rash, itching or hives, swelling of the face, lips, or tongue  anxious  breathing problems  changes in vision  confusion  elevated mood,  decreased need for sleep, racing thoughts, impulsive behavior  eye pain  fast, irregular heartbeat  feeling faint or lightheaded, falls  feeling agitated, angry, or irritable  fever with increased sweating  hallucination, loss of contact with reality  seizures  stiff muscles  suicidal thoughts or other mood changes  tingling, pain, or numbness in the feet or hands  trouble passing urine or change in the amount of urine  trouble sleeping  unusually weak or tired  vomiting  yellowing of the eyes or skin Side effects that usually do not require medical attention (report to your doctor or health care professional if they continue or are bothersome):  change in sex drive or performance  change in appetite or weight  constipation  dizziness  dry mouth  nausea  tired  tremors  upset stomach This list may not describe all possible side effects. Call your doctor for medical advice about side effects. You may report side effects to FDA at 1-800-FDA-1088. Where should I keep my medicine? Keep out of the reach of children. Store at room temperature between 20 and 25 degrees C (68 and 77 degrees F). Throw away any unused medicine after the expiration date. NOTE: This sheet is a summary. It may not cover all possible information. If you have questions about this medicine, talk to your doctor,  pharmacist, or health care provider.  2020 Elsevier/Gold Standard (2018-01-09 13:04:32)    Migraine Headache A migraine headache is a very strong throbbing pain on one side or both sides of your head. This type of headache can also cause other symptoms. It can last from 4 hours to 3 days. Talk with your doctor about what things may bring on (trigger) this condition. What are the causes? The exact cause of this condition is not known. This condition may be triggered or caused by:  Drinking alcohol.  Smoking.  Taking medicines, such as: ? Medicine used to treat chest pain  (nitroglycerin). ? Birth control pill ? Estrogen. ? Some blood pressure medicines.  Eating or drinking certain products.  Doing physical activity. Other things that may trigger a migraine headache include:  Having a menstrual period.  Pregnancy.  Hunger.  Stress.  Not getting enough sleep or getting too much sleep.  Weather changes.  Tiredness (fatigue). What increases the risk?  Being 78-25 years old.  Being female.  Having a family history of migraine headaches.  Being Caucasian.  Having depression or anxiety.  Being very overweight. What are the signs or symptoms?  A throbbing pain. This pain may: ? Happen in any area of the head, such as on one side or both sides. ? Make it hard to do daily activities. ? Get worse with physical activity. ? Get worse around bright lights or loud noises.  Other symptoms may include: ? Feeling sick to your stomach (nauseous). ? Vomiting. ? Dizziness. ? Being sensitive to bright lights, loud noises, or smells.  Before you get a migraine headache, you may get warning signs (an aura). An aura may include: ? Seeing flashing lights or having blind spots. ? Seeing bright spots, halos, or zigzag lines. ? Having tunnel vision or blurred vision. ? Having numbness or a tingling feeling. ? Having trouble talking. ? Having weak muscles.  Some people have symptoms after a migraine headache (postdromal phase), such as: ? Tiredness. ? Trouble thinking (concentrating). How is this treated?  Taking medicines that: ? Relieve pain. ? Relieve the feeling of being sick to your stomach. ? Prevent migraine headaches.  Treatment may also include: ? Having acupuncture. ? Avoiding foods that bring on migraine headaches. ? Learning ways to control your body functions (biofeedback). ? Therapy to help you know and deal with negative thoughts (cognitive behavioral therapy). Follow these instructions at home: Medicines  Take  over-the-counter and prescription medicines only as told by your doctor.  Ask your doctor if the medicine prescribed to you: ? Requires you to avoid driving or using heavy machinery. ? Can cause trouble pooping (constipation). You may need to take these steps to prevent or treat trouble pooping:  Drink enough fluid to keep your pee (urine) pale yellow.  Take over-the-counter or prescription medicines.  Eat foods that are high in fiber. These include beans, whole grains, and fresh fruits and vegetables.  Limit foods that are high in fat and sugar. These include fried or sweet foods. Lifestyle  Do not drink alcohol.  Do not use any products that contain nicotine or tobacco, such as cigarettes, e-cigarettes, and chewing tobacco. If you need help quitting, ask your doctor.  Get at least 8 hours of sleep every night.  Limit and deal with stress. General instructions      Keep a journal to find out what may bring on your migraine headaches. For example, write down: ? What you eat and drink. ? How  much sleep you get. ? Any change in what you eat or drink. ? Any change in your medicines.  If you have a migraine headache: ? Avoid things that make your symptoms worse, such as bright lights. ? It may help to lie down in a dark, quiet room. ? Do not drive or use heavy machinery. ? Ask your doctor what activities are safe for you.  Keep all follow-up visits as told by your doctor. This is important. Contact a doctor if:  You get a migraine headache that is different or worse than others you have had.  You have more than 15 headache days in one month. Get help right away if:  Your migraine headache gets very bad.  Your migraine headache lasts longer than 72 hours.  You have a fever.  You have a stiff neck.  You have trouble seeing.  Your muscles feel weak or like you cannot control them.  You start to lose your balance a lot.  You start to have trouble walking.  You  pass out (faint).  You have a seizure. Summary  A migraine headache is a very strong throbbing pain on one side or both sides of your head. These headaches can also cause other symptoms.  This condition may be treated with medicines and changes to your lifestyle.  Keep a journal to find out what may bring on your migraine headaches.  Contact a doctor if you get a migraine headache that is different or worse than others you have had.  Contact your doctor if you have more than 15 headache days in a month. This information is not intended to replace advice given to you by your health care provider. Make sure you discuss any questions you have with your health care provider. Document Revised: 05/11/2018 Document Reviewed: 03/01/2018 Elsevier Patient Education  2020 [employer].

## 2019-05-31 DIAGNOSIS — R5382 Chronic fatigue, unspecified: Secondary | ICD-10-CM | POA: Diagnosis not present

## 2019-05-31 DIAGNOSIS — R790 Abnormal level of blood mineral: Secondary | ICD-10-CM | POA: Diagnosis not present

## 2019-05-31 DIAGNOSIS — A049 Bacterial intestinal infection, unspecified: Secondary | ICD-10-CM | POA: Diagnosis not present

## 2019-05-31 DIAGNOSIS — K900 Celiac disease: Secondary | ICD-10-CM | POA: Diagnosis not present

## 2019-06-21 DIAGNOSIS — R634 Abnormal weight loss: Secondary | ICD-10-CM | POA: Diagnosis not present

## 2019-06-21 DIAGNOSIS — F419 Anxiety disorder, unspecified: Secondary | ICD-10-CM | POA: Diagnosis not present

## 2019-06-21 DIAGNOSIS — G43109 Migraine with aura, not intractable, without status migrainosus: Secondary | ICD-10-CM | POA: Diagnosis not present

## 2019-06-21 DIAGNOSIS — F4310 Post-traumatic stress disorder, unspecified: Secondary | ICD-10-CM | POA: Diagnosis not present

## 2019-06-25 DIAGNOSIS — M542 Cervicalgia: Secondary | ICD-10-CM | POA: Diagnosis not present

## 2019-06-26 ENCOUNTER — Telehealth: Payer: Self-pay

## 2019-06-26 NOTE — Telephone Encounter (Signed)
Spoke to pt, pt states the she was unable to use nurtec card at pharmacy, states new PA is needed for card to be applied   A PA has been sent vis [facility] for nurtec Status is pending key [id number]

## 2019-07-04 NOTE — Progress Notes (Signed)
I reviewed note and agree with plan.   Dyke Weible R. Marrio Scribner, MD 07/04/2019, 4:20 PM Certified in Neurology, Neurophysiology and Neuroimaging  [facility] [address], [address] , [place] [address] [phone]  

## 2019-07-08 ENCOUNTER — Ambulatory Visit: Payer: BC Managed Care – PPO | Admitting: Family Medicine

## 2019-07-08 DIAGNOSIS — G43709 Chronic migraine without aura, not intractable, without status migrainosus: Secondary | ICD-10-CM

## 2019-07-08 DIAGNOSIS — IMO0002 Reserved for concepts with insufficient information to code with codable children: Secondary | ICD-10-CM

## 2019-07-08 MED ORDER — SUMATRIPTAN SUCCINATE 50 MG PO TABS: 50.0000 mg | ORAL_TABLET | ORAL | 0 refills | Status: DC | PRN

## 2019-07-08 NOTE — Progress Notes (Signed)
She tolerated first Botox procedure well. Initially she had not noted benefit from treatment but today reports that migraine symptoms have significant improved. She has not had a migraine in 4-6 weeks. Dull tension headaches continue several times a week. She was started on gabapentin by psychiatry that has helped with sleep. She is participating in PT for neck pain. Dry needling is helpful. She was not able to afford Nurtec copay. Rizatriptan was not helpful. She is taking Armour thyroid that she feels is helping.   Consent Form Botulism Toxin Injection For Chronic Migraine    Reviewed orally with patient, additionally signature is on file:  Botulism toxin has been approved by the Federal drug administration for treatment of chronic migraine. Botulism toxin does not cure chronic migraine and it may not be effective in some patients.  The administration of botulism toxin is accomplished by injecting a small amount of toxin into the muscles of the neck and head. Dosage must be titrated for each individual. Any benefits resulting from botulism toxin tend to wear off after 3 months with a repeat injection required if benefit is to be maintained. Injections are usually done every 3-4 months with maximum effect peak achieved by about 2 or 3 weeks. Botulism toxin is expensive and you should be sure of what costs you will incur resulting from the injection.  The side effects of botulism toxin use for chronic migraine may include:   -Transient, and usually mild, facial weakness with facial injections  -Transient, and usually mild, head or neck weakness with head/neck injections  -Reduction or loss of forehead facial animation due to forehead muscle weakness  -Eyelid drooping  -Dry eye  -Pain at the site of injection or bruising at the site of injection  -Double vision  -Potential unknown long term risks   Contraindications: You should not have Botox if you are pregnant, nursing, allergic to  albumin, have an infection, skin condition, or muscle weakness at the site of the injection, or have myasthenia gravis, Lambert-Eaton syndrome, or ALS.  It is also possible that as with any injection, there may be an allergic reaction or no effect from the medication. Reduced effectiveness after repeated injections is sometimes seen and rarely infection at the injection site may occur. All care will be taken to prevent these side effects. If therapy is given over a long time, atrophy and wasting in the muscle injected may occur. Occasionally the patient's become refractory to treatment because they develop antibodies to the toxin. In this event, therapy needs to be modified.  I have read the above information and consent to the administration of botulism toxin.    BOTOX PROCEDURE NOTE FOR MIGRAINE HEADACHE  Contraindications and precautions discussed with patient(above). Aseptic procedure was observed and patient tolerated procedure. Procedure performed by Shawnie Dapper, FNP-C.   The condition has existed for more than 6 months, and pt does not have a diagnosis of ALS, Myasthenia Gravis or Lambert-Eaton Syndrome.  Risks and benefits of injections discussed and pt agrees to proceed with the procedure.  Written consent obtained  These injections are medically necessary. Pt  receives good benefits from these injections. These injections do not cause sedations or hallucinations which the oral therapies may cause.   Description of procedure:  The patient was placed in a sitting position. The standard protocol was used for Botox as follows, with 5 units of Botox injected at each site:  -Procerus muscle, midline injection  -Corrugator muscle, bilateral injection  -Frontalis muscle, bilateral  injection, with 2 sites each side, medial injection was performed in the upper one third of the frontalis muscle, in the region vertical from the medial inferior edge of the superior orbital rim. The lateral  injection was again in the upper one third of the forehead vertically above the lateral limbus of the cornea, 1.5 cm lateral to the medial injection site.  -Temporalis muscle injection, 4 sites, bilaterally. The first injection was 3 cm above the tragus of the ear, second injection site was 1.5 cm to 3 cm up from the first injection site in line with the tragus of the ear. The third injection site was 1.5-3 cm forward between the first 2 injection sites. The fourth injection site was 1.5 cm posterior to the second injection site. 5th site laterally in the temporalis  muscleat the level of the outer canthus.  -Occipitalis muscle injection, 3 sites, bilaterally. The first injection was done one half way between the occipital protuberance and the tip of the mastoid process behind the ear. The second injection site was done lateral and superior to the first, 1 fingerbreadth from the first injection. The third injection site was 1 fingerbreadth superiorly and medially from the first injection site.  -Cervical paraspinal muscle injection, 2 sites, bilaterally. The first injection site was 1 cm from the midline of the cervical spine, 3 cm inferior to the lower border of the occipital protuberance. The second injection site was 1.5 cm superiorly and laterally to the first injection site.  -Trapezius muscle injection was performed at 3 sites, bilaterally. The first injection site was in the upper trapezius muscle halfway between the inflection point of the neck, and the acromion. The second injection site was one half way between the acromion and the first injection site. The third injection was done between the first injection site and the inflection point of the neck.   Will return for repeat injection in 3 months.   A total of 200 units of Botox was prepared, 155 units of Botox was injected as documented above, any Botox not injected was wasted. The patient tolerated the procedure well, there were no  complications of the above procedure.

## 2019-07-08 NOTE — Progress Notes (Signed)
Botox-100 unitsx2 vials Lot: [id number] Expiration: 12/2021 NDC: 2119-4174-08   0.9% Sodium Chloride- 73mL total [id number] Expiration: 10/2021 NDC: 14970-263-78  Dx: MIGRAINES SP  Consent signed

## 2019-07-09 DIAGNOSIS — F4310 Post-traumatic stress disorder, unspecified: Secondary | ICD-10-CM | POA: Diagnosis not present

## 2019-07-09 DIAGNOSIS — F411 Generalized anxiety disorder: Secondary | ICD-10-CM | POA: Diagnosis not present

## 2019-07-15 DIAGNOSIS — H52203 Unspecified astigmatism, bilateral: Secondary | ICD-10-CM | POA: Diagnosis not present

## 2019-07-15 DIAGNOSIS — H5713 Ocular pain, bilateral: Secondary | ICD-10-CM | POA: Diagnosis not present

## 2019-07-15 DIAGNOSIS — H5213 Myopia, bilateral: Secondary | ICD-10-CM | POA: Diagnosis not present

## 2019-07-18 NOTE — Telephone Encounter (Signed)
Spoke to pt she is unable fill nurtec NP sent inSUMAtriptan (IMITREX) 50 MG

## 2019-07-28 DIAGNOSIS — K589 Irritable bowel syndrome without diarrhea: Secondary | ICD-10-CM | POA: Diagnosis not present

## 2019-07-28 DIAGNOSIS — A049 Bacterial intestinal infection, unspecified: Secondary | ICD-10-CM | POA: Diagnosis not present

## 2019-07-28 DIAGNOSIS — R140 Abdominal distension (gaseous): Secondary | ICD-10-CM | POA: Diagnosis not present

## 2019-07-28 DIAGNOSIS — R109 Unspecified abdominal pain: Secondary | ICD-10-CM | POA: Diagnosis not present

## 2019-08-13 DIAGNOSIS — G43709 Chronic migraine without aura, not intractable, without status migrainosus: Secondary | ICD-10-CM | POA: Diagnosis not present

## 2019-08-14 ENCOUNTER — Telehealth: Payer: Self-pay | Admitting: Family Medicine

## 2019-08-14 NOTE — Telephone Encounter (Signed)
For some reason, medication from [facility] delivered today. (2) 100U vials of Botox. Patient's next appointment is not until September 8th. Patient has an active PA on file with [facility] [place], [place] #[id number] (03/26/19- 09/22/19). PA will expire before next appointment. I will initiate new PA for patient.

## 2019-08-14 NOTE — Telephone Encounter (Signed)
Submitted PA request to [facility] via CoverMyMeds.

## 2019-08-19 NOTE — Telephone Encounter (Signed)
Received approval from [facility] [place] via fax. PA #[id number] (08/14/19- 08/12/20)

## 2019-09-03 DIAGNOSIS — F411 Generalized anxiety disorder: Secondary | ICD-10-CM | POA: Diagnosis not present

## 2019-09-03 DIAGNOSIS — F4310 Post-traumatic stress disorder, unspecified: Secondary | ICD-10-CM | POA: Diagnosis not present

## 2019-09-04 DIAGNOSIS — R5383 Other fatigue: Secondary | ICD-10-CM | POA: Diagnosis not present

## 2019-09-04 DIAGNOSIS — Z79899 Other long term (current) drug therapy: Secondary | ICD-10-CM | POA: Diagnosis not present

## 2019-09-04 DIAGNOSIS — M2550 Pain in unspecified joint: Secondary | ICD-10-CM | POA: Diagnosis not present

## 2019-09-04 DIAGNOSIS — K219 Gastro-esophageal reflux disease without esophagitis: Secondary | ICD-10-CM | POA: Diagnosis not present

## 2019-09-04 DIAGNOSIS — Z793 Long term (current) use of hormonal contraceptives: Secondary | ICD-10-CM | POA: Diagnosis not present

## 2019-09-04 DIAGNOSIS — K9041 Non-celiac gluten sensitivity: Secondary | ICD-10-CM | POA: Diagnosis not present

## 2019-09-04 DIAGNOSIS — Z8379 Family history of other diseases of the digestive system: Secondary | ICD-10-CM | POA: Diagnosis not present

## 2019-09-04 DIAGNOSIS — E063 Autoimmune thyroiditis: Secondary | ICD-10-CM | POA: Diagnosis not present

## 2019-10-09 ENCOUNTER — Ambulatory Visit: Payer: BC Managed Care – PPO | Admitting: Family Medicine

## 2019-10-09 ENCOUNTER — Other Ambulatory Visit: Payer: Self-pay

## 2019-10-09 DIAGNOSIS — G43709 Chronic migraine without aura, not intractable, without status migrainosus: Secondary | ICD-10-CM

## 2019-10-09 DIAGNOSIS — IMO0002 Reserved for concepts with insufficient information to code with codable children: Secondary | ICD-10-CM

## 2019-10-09 MED ORDER — UBRELVY 100 MG PO TABS
100.0000 mg | ORAL_TABLET | Freq: Every day | ORAL | 11 refills | Status: DC | PRN
Start: 2019-10-09 — End: 2020-10-28

## 2019-10-09 NOTE — Progress Notes (Signed)
Tina Haney continues to doing well with Botox therapy. Migraines have reduced in frequency and intensity. She may average 1 migraine per month. Tension headaches are much better as well. She tried sumatriptan but did not feel it helped to abort migraine. Psychiatry increased gabapentin to 600mg  at bedtime. She continues dry needling. She continues to work with integrative medicine for management of thyroid. We will try Ubrelvy for abortive therapy. She has tried and failed sumatriptan and rizatriptan.   Consent Form Botulism Toxin Injection For Chronic Migraine    Reviewed orally with patient, additionally signature is on file:  Botulism toxin has been approved by the Federal drug administration for treatment of chronic migraine. Botulism toxin does not cure chronic migraine and it may not be effective in some patients.  The administration of botulism toxin is accomplished by injecting a small amount of toxin into the muscles of the neck and head. Dosage must be titrated for each individual. Any benefits resulting from botulism toxin tend to wear off after 3 months with a repeat injection required if benefit is to be maintained. Injections are usually done every 3-4 months with maximum effect peak achieved by about 2 or 3 weeks. Botulism toxin is expensive and you should be sure of what costs you will incur resulting from the injection.  The side effects of botulism toxin use for chronic migraine may include:   -Transient, and usually mild, facial weakness with facial injections  -Transient, and usually mild, head or neck weakness with head/neck injections  -Reduction or loss of forehead facial animation due to forehead muscle weakness  -Eyelid drooping  -Dry eye  -Pain at the site of injection or bruising at the site of injection  -Double vision  -Potential unknown long term risks   Contraindications: You should not have Botox if you are pregnant, nursing, allergic to albumin, have an  infection, skin condition, or muscle weakness at the site of the injection, or have myasthenia gravis, Lambert-Eaton syndrome, or ALS.  It is also possible that as with any injection, there may be an allergic reaction or no effect from the medication. Reduced effectiveness after repeated injections is sometimes seen and rarely infection at the injection site may occur. All care will be taken to prevent these side effects. If therapy is given over a long time, atrophy and wasting in the muscle injected may occur. Occasionally the patient's become refractory to treatment because they develop antibodies to the toxin. In this event, therapy needs to be modified.  I have read the above information and consent to the administration of botulism toxin.    BOTOX PROCEDURE NOTE FOR MIGRAINE HEADACHE  Contraindications and precautions discussed with patient(above). Aseptic procedure was observed and patient tolerated procedure. Procedure performed by , FNP-C.   The condition has existed for more than 6 months, and pt does not have a diagnosis of ALS, Myasthenia Gravis or Lambert-Eaton Syndrome.  Risks and benefits of injections discussed and pt agrees to proceed with the procedure.  Written consent obtained  These injections are medically necessary. Pt  receives good benefits from these injections. These injections do not cause sedations or hallucinations which the oral therapies may cause.   Description of procedure:  The patient was placed in a sitting position. The standard protocol was used for Botox as follows, with 5 units of Botox injected at each site:  -Procerus muscle, midline injection  -Corrugator muscle, bilateral injection  -Frontalis muscle, bilateral injection, with 2 sites each side, medial injection  was performed in the upper one third of the frontalis muscle, in the region vertical from the medial inferior edge of the superior orbital rim. The lateral injection was again in  the upper one third of the forehead vertically above the lateral limbus of the cornea, 1.5 cm lateral to the medial injection site.  -Temporalis muscle injection, 4 sites, bilaterally. The first injection was 3 cm above the tragus of the ear, second injection site was 1.5 cm to 3 cm up from the first injection site in line with the tragus of the ear. The third injection site was 1.5-3 cm forward between the first 2 injection sites. The fourth injection site was 1.5 cm posterior to the second injection site. 5th site laterally in the temporalis  muscleat the level of the outer canthus.  -Occipitalis muscle injection, 3 sites, bilaterally. The first injection was done one half way between the occipital protuberance and the tip of the mastoid process behind the ear. The second injection site was done lateral and superior to the first, 1 fingerbreadth from the first injection. The third injection site was 1 fingerbreadth superiorly and medially from the first injection site.  -Cervical paraspinal muscle injection, 2 sites, bilaterally. The first injection site was 1 cm from the midline of the cervical spine, 3 cm inferior to the lower border of the occipital protuberance. The second injection site was 1.5 cm superiorly and laterally to the first injection site.  -Trapezius muscle injection was performed at 3 sites, bilaterally. The first injection site was in the upper trapezius muscle halfway between the inflection point of the neck, and the acromion. The second injection site was one half way between the acromion and the first injection site. The third injection was done between the first injection site and the inflection point of the neck.   Will return for repeat injection in 3 months.   A total of 200 units of Botox was prepared, 155 units of Botox was injected as documented above, any Botox not injected was wasted. The patient tolerated the procedure well, there were no complications of the above  procedure.

## 2019-10-09 NOTE — Progress Notes (Signed)
Botox- 100 units x 2 vials Lot: [id number] Expiration: 10/23 NDC: 3474-2595-63  Bacteriostatic 0.9% Sodium Chloride- 65mL total Lot: [id number] Expiration: 02/23 NDC: 2951-8841-66  Dx: A63.016 S/P  Consent was given and signed today.

## 2019-10-14 ENCOUNTER — Telehealth: Payer: Self-pay | Admitting: Family Medicine

## 2019-10-14 NOTE — Telephone Encounter (Signed)
Received forms from [facility] that needed to be completed. Forms have been completed. Will have Amy to sign tomorrow and fax back to [employer].

## 2019-10-14 NOTE — Telephone Encounter (Signed)
Received a PA request for Ubrelvy from [email or web address]. Key is [id number]. Will await determination.

## 2019-10-17 NOTE — Telephone Encounter (Signed)
Received fax from [facility]. PA for Tina Haney has been approved from 10/14/19 to 01/05/20.

## 2019-10-24 DIAGNOSIS — F411 Generalized anxiety disorder: Secondary | ICD-10-CM | POA: Diagnosis not present

## 2019-10-24 DIAGNOSIS — F4310 Post-traumatic stress disorder, unspecified: Secondary | ICD-10-CM | POA: Diagnosis not present

## 2019-11-06 DIAGNOSIS — F4323 Adjustment disorder with mixed anxiety and depressed mood: Secondary | ICD-10-CM | POA: Diagnosis not present

## 2019-11-11 DIAGNOSIS — G43709 Chronic migraine without aura, not intractable, without status migrainosus: Secondary | ICD-10-CM | POA: Diagnosis not present

## 2019-11-12 ENCOUNTER — Telehealth: Payer: Self-pay | Admitting: Family Medicine

## 2019-11-12 NOTE — Telephone Encounter (Signed)
Patient's next Botox appointment is 12/15. I received (2) 100U vials of Botox from [facility] today.

## 2019-11-14 DIAGNOSIS — R5382 Chronic fatigue, unspecified: Secondary | ICD-10-CM | POA: Diagnosis not present

## 2019-11-14 DIAGNOSIS — K900 Celiac disease: Secondary | ICD-10-CM | POA: Diagnosis not present

## 2019-11-14 DIAGNOSIS — A049 Bacterial intestinal infection, unspecified: Secondary | ICD-10-CM | POA: Diagnosis not present

## 2019-11-14 DIAGNOSIS — N959 Unspecified menopausal and perimenopausal disorder: Secondary | ICD-10-CM | POA: Diagnosis not present

## 2019-11-14 DIAGNOSIS — R790 Abnormal level of blood mineral: Secondary | ICD-10-CM | POA: Diagnosis not present

## 2019-11-18 DIAGNOSIS — F4323 Adjustment disorder with mixed anxiety and depressed mood: Secondary | ICD-10-CM | POA: Diagnosis not present

## 2019-11-26 DIAGNOSIS — F4310 Post-traumatic stress disorder, unspecified: Secondary | ICD-10-CM | POA: Diagnosis not present

## 2019-11-26 DIAGNOSIS — F411 Generalized anxiety disorder: Secondary | ICD-10-CM | POA: Diagnosis not present

## 2019-12-02 DIAGNOSIS — F4323 Adjustment disorder with mixed anxiety and depressed mood: Secondary | ICD-10-CM | POA: Diagnosis not present

## 2019-12-16 DIAGNOSIS — F4323 Adjustment disorder with mixed anxiety and depressed mood: Secondary | ICD-10-CM | POA: Diagnosis not present

## 2019-12-23 DIAGNOSIS — G43809 Other migraine, not intractable, without status migrainosus: Secondary | ICD-10-CM | POA: Diagnosis not present

## 2019-12-23 DIAGNOSIS — B370 Candidal stomatitis: Secondary | ICD-10-CM | POA: Diagnosis not present

## 2019-12-23 DIAGNOSIS — Z23 Encounter for immunization: Secondary | ICD-10-CM | POA: Diagnosis not present

## 2019-12-23 DIAGNOSIS — B002 Herpesviral gingivostomatitis and pharyngotonsillitis: Secondary | ICD-10-CM | POA: Diagnosis not present

## 2019-12-23 DIAGNOSIS — F419 Anxiety disorder, unspecified: Secondary | ICD-10-CM | POA: Diagnosis not present

## 2019-12-25 ENCOUNTER — Telehealth: Payer: Self-pay | Admitting: Family Medicine

## 2019-12-25 NOTE — Telephone Encounter (Signed)
PA was approved 12/25/19 to 12/23/20. Will fax a copy of the determination to patient's pharmacy.

## 2019-12-25 NOTE — Telephone Encounter (Signed)
Received a PA renewal request for Ubrelvy 100mg . Current PA expires on 01/05/20. PA was started on 14/5/21. Key is [id number]. Per [email or web address], a determination will be made within 72 hours. Will check back later for a determination.

## 2020-01-06 ENCOUNTER — Telehealth: Payer: Self-pay | Admitting: *Deleted

## 2020-01-06 DIAGNOSIS — F4323 Adjustment disorder with mixed anxiety and depressed mood: Secondary | ICD-10-CM | POA: Diagnosis not present

## 2020-01-06 NOTE — Telephone Encounter (Addendum)
Error, Aimovig was d/c Jan 2021.

## 2020-01-08 DIAGNOSIS — F4310 Post-traumatic stress disorder, unspecified: Secondary | ICD-10-CM | POA: Diagnosis not present

## 2020-01-08 DIAGNOSIS — F411 Generalized anxiety disorder: Secondary | ICD-10-CM | POA: Diagnosis not present

## 2020-01-15 ENCOUNTER — Ambulatory Visit (INDEPENDENT_AMBULATORY_CARE_PROVIDER_SITE_OTHER): Payer: BC Managed Care – PPO | Admitting: Family Medicine

## 2020-01-15 DIAGNOSIS — G43709 Chronic migraine without aura, not intractable, without status migrainosus: Secondary | ICD-10-CM

## 2020-01-15 NOTE — Progress Notes (Signed)
Botox-100unitsx2 vials Lot: [id number] Expiration: 03/2022 NDC: 7517-0017-49   0.9% Sodium Chloride- 63mL total Lot: [id number] Expiration: 03/2021 NDC: 16384-665-99  Dx: Chronic Migraine SP  Consent signed

## 2020-01-15 NOTE — Progress Notes (Signed)
She continues to do very well from a headache standpoint. She can go a month or so without a migraine. She continues to work closely with psychiatry and internal med. Iron deficiency now corrected. Thyroid is well managed. She admits to more stress at home. She continues schooling.   Consent Form Botulism Toxin Injection For Chronic Migraine    Reviewed orally with patient, additionally signature is on file:  Botulism toxin has been approved by the Federal drug administration for treatment of chronic migraine. Botulism toxin does not cure chronic migraine and it may not be effective in some patients.  The administration of botulism toxin is accomplished by injecting a small amount of toxin into the muscles of the neck and head. Dosage must be titrated for each individual. Any benefits resulting from botulism toxin tend to wear off after 3 months with a repeat injection required if benefit is to be maintained. Injections are usually done every 3-4 months with maximum effect peak achieved by about 2 or 3 weeks. Botulism toxin is expensive and you should be sure of what costs you will incur resulting from the injection.  The side effects of botulism toxin use for chronic migraine may include:   -Transient, and usually mild, facial weakness with facial injections  -Transient, and usually mild, head or neck weakness with head/neck injections  -Reduction or loss of forehead facial animation due to forehead muscle weakness  -Eyelid drooping  -Dry eye  -Pain at the site of injection or bruising at the site of injection  -Double vision  -Potential unknown long term risks   Contraindications: You should not have Botox if you are pregnant, nursing, allergic to albumin, have an infection, skin condition, or muscle weakness at the site of the injection, or have myasthenia gravis, Lambert-Eaton syndrome, or ALS.  It is also possible that as with any injection, there may be an allergic reaction or no  effect from the medication. Reduced effectiveness after repeated injections is sometimes seen and rarely infection at the injection site may occur. All care will be taken to prevent these side effects. If therapy is given over a long time, atrophy and wasting in the muscle injected may occur. Occasionally the patient's become refractory to treatment because they develop antibodies to the toxin. In this event, therapy needs to be modified.  I have read the above information and consent to the administration of botulism toxin.    BOTOX PROCEDURE NOTE FOR MIGRAINE HEADACHE  Contraindications and precautions discussed with patient(above). Aseptic procedure was observed and patient tolerated procedure. Procedure performed by Shawnie Dapper, FNP-C.   The condition has existed for more than 6 months, and pt does not have a diagnosis of ALS, Myasthenia Gravis or Lambert-Eaton Syndrome.  Risks and benefits of injections discussed and pt agrees to proceed with the procedure.  Written consent obtained  These injections are medically necessary. Pt  receives good benefits from these injections. These injections do not cause sedations or hallucinations which the oral therapies may cause.   Description of procedure:  The patient was placed in a sitting position. The standard protocol was used for Botox as follows, with 5 units of Botox injected at each site:  -Procerus muscle, midline injection  -Corrugator muscle, bilateral injection  -Frontalis muscle, bilateral injection, with 2 sites each side, medial injection was performed in the upper one third of the frontalis muscle, in the region vertical from the medial inferior edge of the superior orbital rim. The lateral injection was again  in the upper one third of the forehead vertically above the lateral limbus of the cornea, 1.5 cm lateral to the medial injection site.  -Temporalis muscle injection, 4 sites, bilaterally. The first injection was 3 cm above the  tragus of the ear, second injection site was 1.5 cm to 3 cm up from the first injection site in line with the tragus of the ear. The third injection site was 1.5-3 cm forward between the first 2 injection sites. The fourth injection site was 1.5 cm posterior to the second injection site. 5th site laterally in the temporalis  muscleat the level of the outer canthus.  -Occipitalis muscle injection, 3 sites, bilaterally. The first injection was done one half way between the occipital protuberance and the tip of the mastoid process behind the ear. The second injection site was done lateral and superior to the first, 1 fingerbreadth from the first injection. The third injection site was 1 fingerbreadth superiorly and medially from the first injection site.  -Cervical paraspinal muscle injection, 2 sites, bilaterally. The first injection site was 1 cm from the midline of the cervical spine, 3 cm inferior to the lower border of the occipital protuberance. The second injection site was 1.5 cm superiorly and laterally to the first injection site.  -Trapezius muscle injection was performed at 3 sites, bilaterally. The first injection site was in the upper trapezius muscle halfway between the inflection point of the neck, and the acromion. The second injection site was one half way between the acromion and the first injection site. The third injection was done between the first injection site and the inflection point of the neck.   Will return for repeat injection in 3 months.   A total of 200 units of Botox was prepared, 155 units of Botox was injected as documented above, any Botox not injected was wasted. The patient tolerated the procedure well, there were no complications of the above procedure.

## 2020-01-16 NOTE — Progress Notes (Signed)
I reviewed note and agree with plan.   Suanne Marker, MD 01/16/2020, 10:55 AM Certified in Neurology, Neurophysiology and Neuroimaging  [facility] [address], [address] [place], [place] [address] [phone]

## 2020-01-29 DIAGNOSIS — F4323 Adjustment disorder with mixed anxiety and depressed mood: Secondary | ICD-10-CM | POA: Diagnosis not present

## 2020-02-06 DIAGNOSIS — F4323 Adjustment disorder with mixed anxiety and depressed mood: Secondary | ICD-10-CM | POA: Diagnosis not present

## 2020-02-19 IMAGING — MR MR HEAD WO/W CM
7 of 12 series · 29 of 48 positions shown · IV contrast (multihance)
Comparison: None.

CLINICAL DATA: Headache and numbness on both sides. Visual
disturbance over the last several years.

EXAM:
MRI HEAD WITHOUT AND WITH CONTRAST
TECHNIQUE: Multiplanar, multiecho pulse sequences of the brain and surrounding
structures were obtained without and with intravenous contrast.
CONTRAST:  10mL MULTIHANCE GADOBENATE DIMEGLUMINE 529 MG/ML IV SOLN

[Series 3: DWI · axial · 3.0mm · 0.68mm/px · z∈[-95,+67]mm · 5 of 55 slices shown (1 of 4)]
[im 1/55]
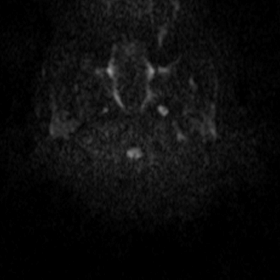
[im 14/55]
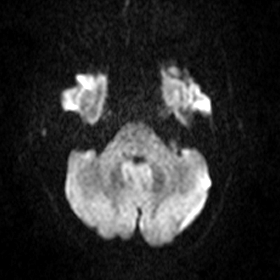
[im 28/55]
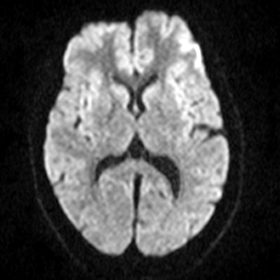
[im 41/55]
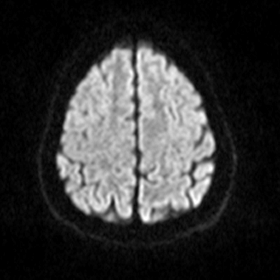
[im 55/55]
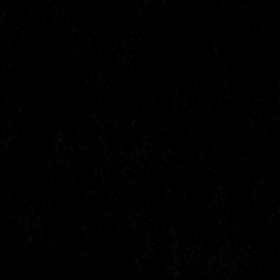

[Series 4: DWI · axial · 3.0mm · 0.74mm/px · z∈[-95,+63]mm · 5 of 54 slices shown (2 of 4)]
[im 1/54]
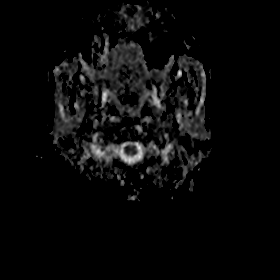
[im 14/54]
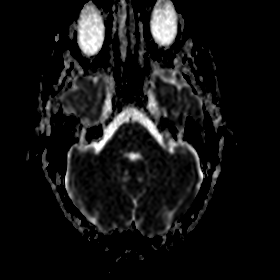
[im 27/54]
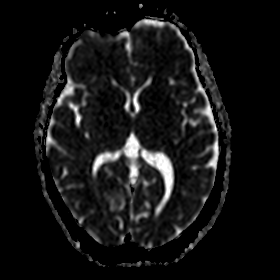
[im 40/54]
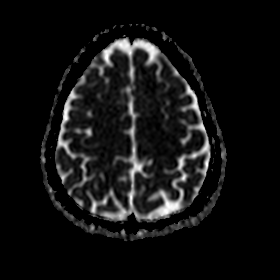
[im 54/54]
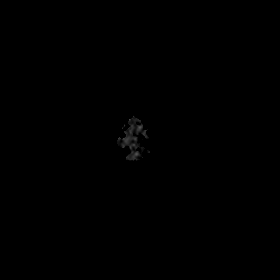

[Series 5: DWI · coronal · 5.0mm · 0.46mm/px · 3 of 34 slices shown (3 of 4)]
[im 1/34]
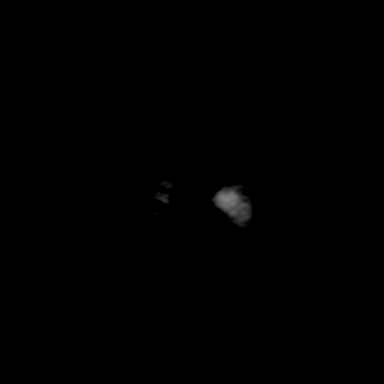
[im 17/34]
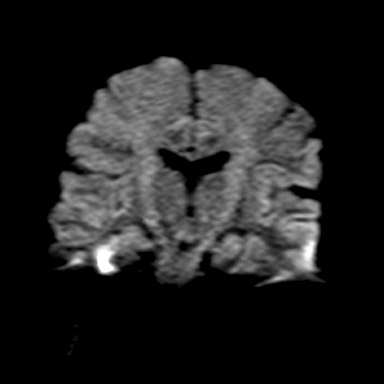
[im 34/34]
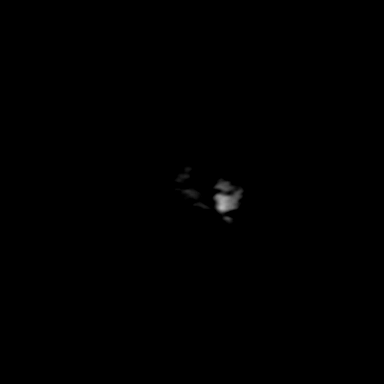

[Series 6: DWI · coronal · 5.0mm · 0.50mm/px · 3 of 34 slices shown (4 of 4)]
[im 1/34]
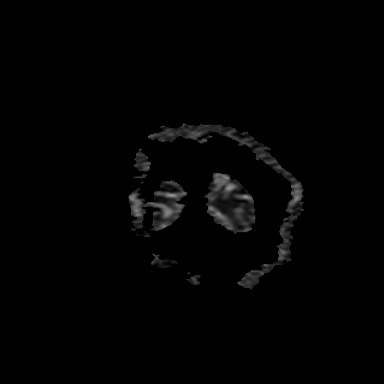
[im 17/34]
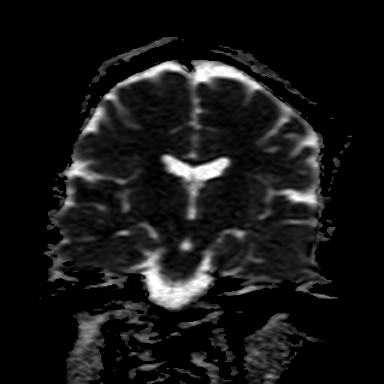
[im 34/34]
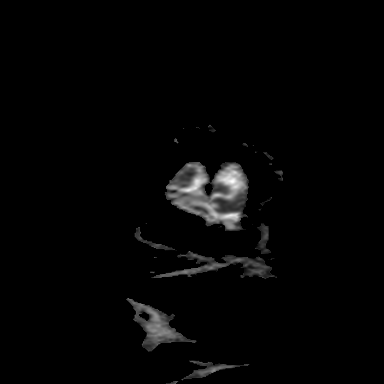

[Series 8: FLAIR · axial · 3.0mm · 0.33mm/px · z∈[-82,+55]mm · 4 of 47 slices shown]
[im 1/47]
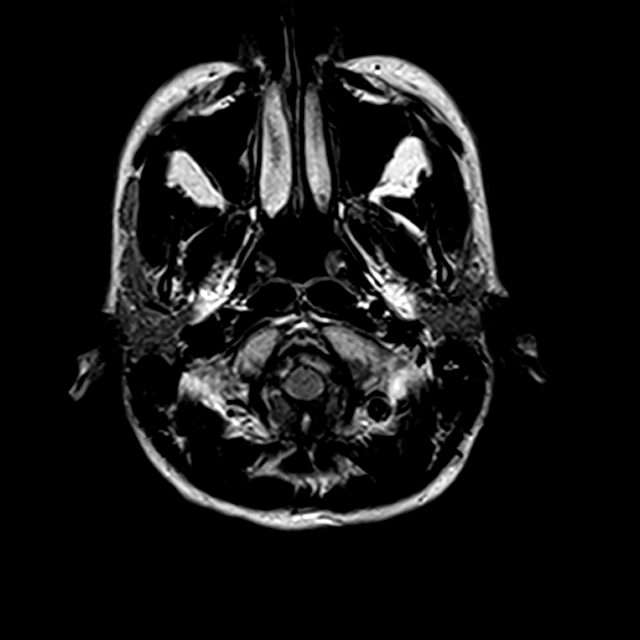
[im 16/47]
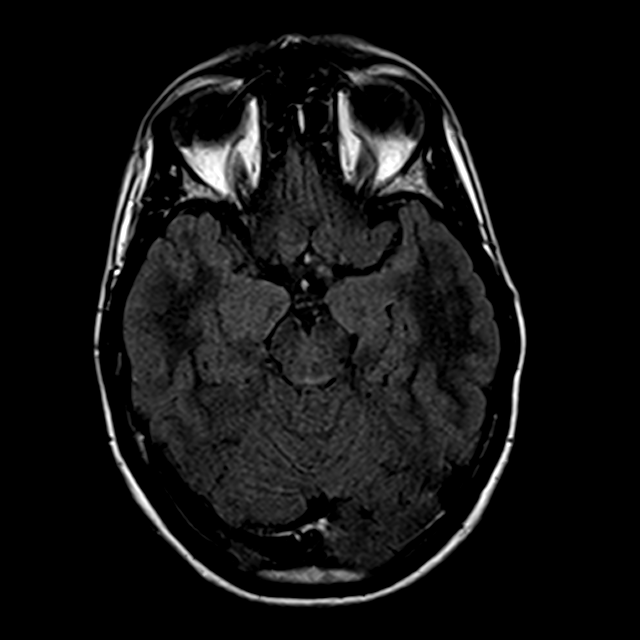
[im 31/47]
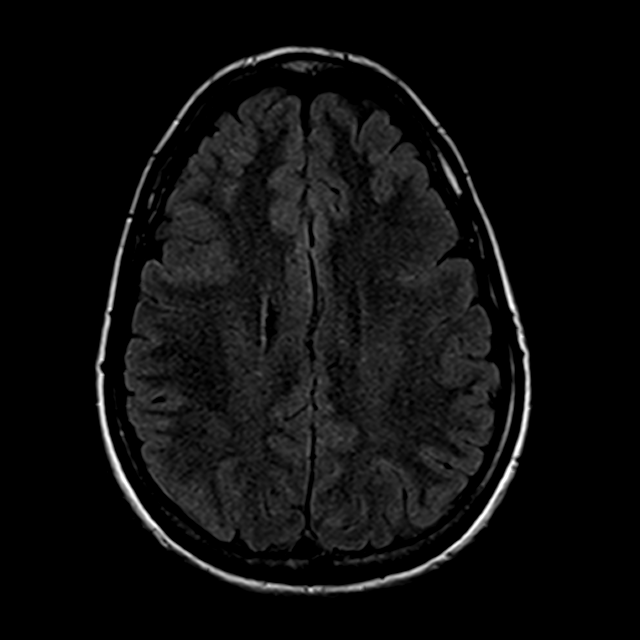
[im 47/47]
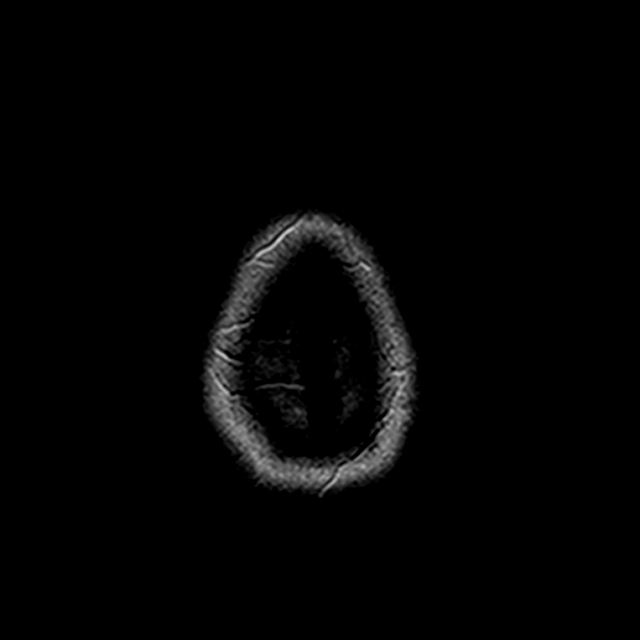

[Series 12: T1 post-contrast · axial · 2.0mm · 0.41mm/px · z∈[-91,+61]mm · 7 of 77 slices shown (1 of 2)]
[im 1/77]
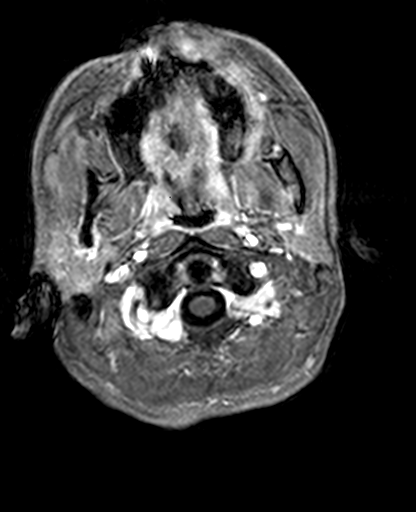
[im 13/77]
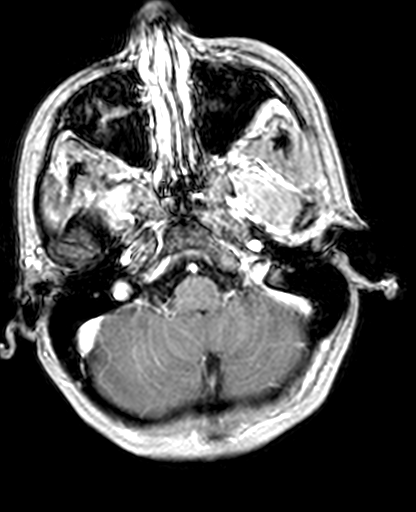
[im 26/77]
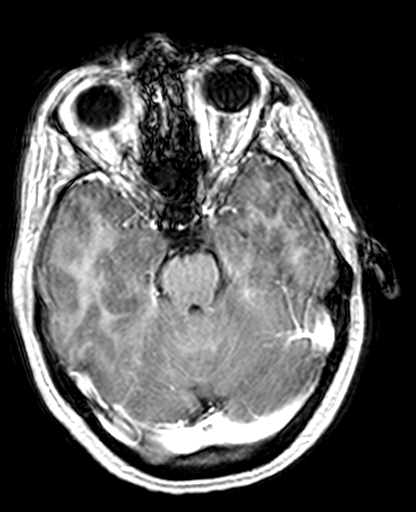
[im 39/77]
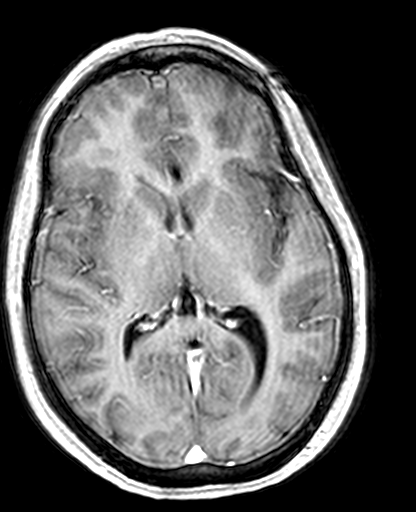
[im 51/77]
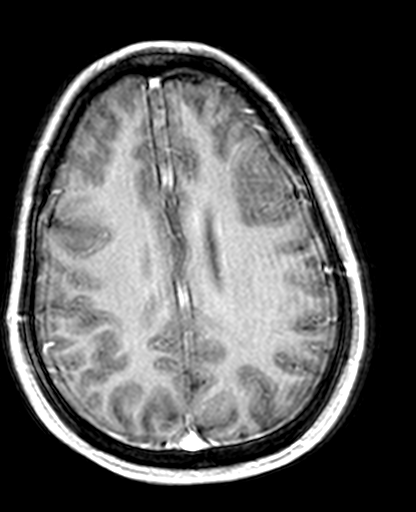
[im 64/77]
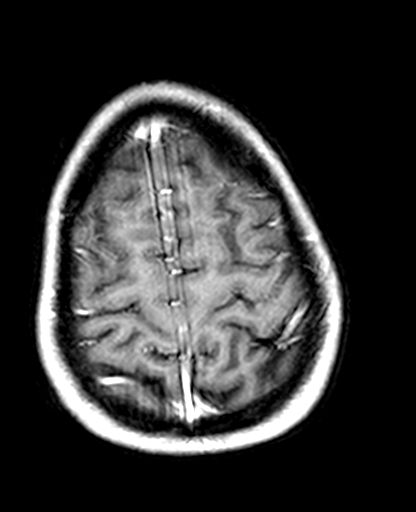
[im 77/77]
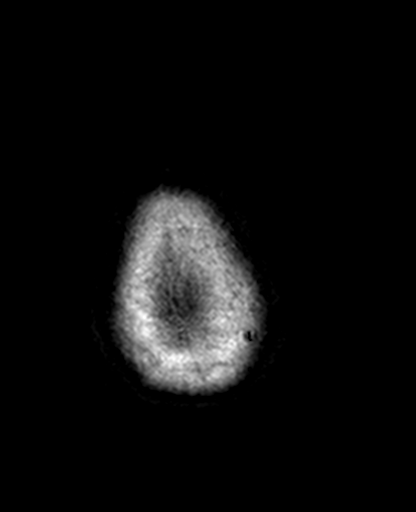

[Series 13: T1 post-contrast · coronal · 5.0mm · 0.39mm/px · 2 of 28 slices shown (2 of 2)]
[im 1/28]
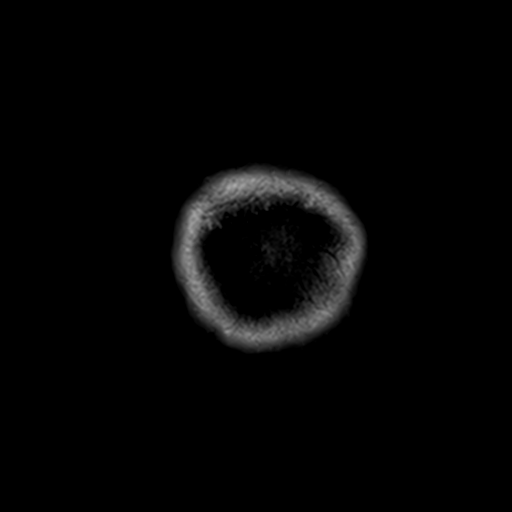
[im 14/28]
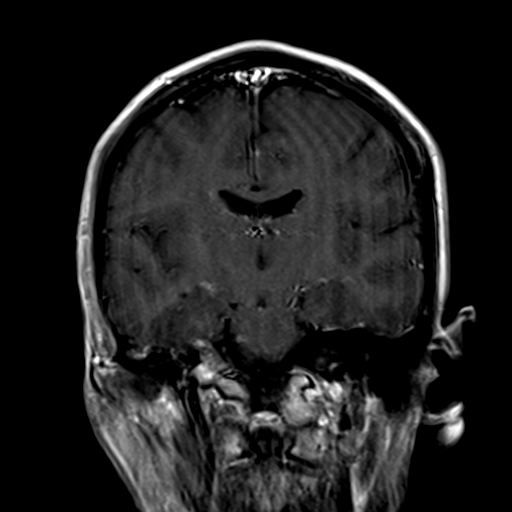

[29 of 48 positions shown; findings below may reference images not displayed]

FINDINGS: Brain: Brain has normal appearance without evidence of malformation,
atrophy, old or acute small or large vessel infarction, mass lesion,
hemorrhage, hydrocephalus or extra-axial collection.

Vascular: Major vessels at the base of the brain show flow. Venous
sinuses appear patent.

Skull and upper cervical spine: Normal.

Sinuses/Orbits: Clear/normal.

Other: None significant.
IMPRESSION: Normal examination. No abnormality seen to explain headache or the
other presenting symptoms.

## 2020-02-20 DIAGNOSIS — F4323 Adjustment disorder with mixed anxiety and depressed mood: Secondary | ICD-10-CM | POA: Diagnosis not present

## 2020-02-24 ENCOUNTER — Telehealth: Payer: Self-pay | Admitting: Family Medicine

## 2020-02-24 NOTE — Telephone Encounter (Signed)
Patient has a Botox appointment on 3/17. I received a fax from [facility] stating Botox TBD 1/26.

## 2020-02-26 DIAGNOSIS — G43709 Chronic migraine without aura, not intractable, without status migrainosus: Secondary | ICD-10-CM | POA: Diagnosis not present

## 2020-02-26 NOTE — Telephone Encounter (Signed)
Received a fax from [facility] stating that the Botox has been delayed. I called and spoke with Tiara, who states that the order is scheduled to arrive on time.

## 2020-02-27 DIAGNOSIS — F4323 Adjustment disorder with mixed anxiety and depressed mood: Secondary | ICD-10-CM | POA: Diagnosis not present

## 2020-02-27 NOTE — Telephone Encounter (Signed)
(  2) 100U vials of Botox delivered today from [facility].

## 2020-03-03 DIAGNOSIS — F411 Generalized anxiety disorder: Secondary | ICD-10-CM | POA: Diagnosis not present

## 2020-03-03 DIAGNOSIS — F4310 Post-traumatic stress disorder, unspecified: Secondary | ICD-10-CM | POA: Diagnosis not present

## 2020-03-12 DIAGNOSIS — F4323 Adjustment disorder with mixed anxiety and depressed mood: Secondary | ICD-10-CM | POA: Diagnosis not present

## 2020-03-19 DIAGNOSIS — F4323 Adjustment disorder with mixed anxiety and depressed mood: Secondary | ICD-10-CM | POA: Diagnosis not present

## 2020-04-02 DIAGNOSIS — F4323 Adjustment disorder with mixed anxiety and depressed mood: Secondary | ICD-10-CM | POA: Diagnosis not present

## 2020-04-16 ENCOUNTER — Ambulatory Visit: Payer: BC Managed Care – PPO | Admitting: Family Medicine

## 2020-04-16 DIAGNOSIS — G43709 Chronic migraine without aura, not intractable, without status migrainosus: Secondary | ICD-10-CM

## 2020-04-16 NOTE — Patient Instructions (Signed)

## 2020-04-16 NOTE — Progress Notes (Signed)
Botox- 100 units x 1 vial Lot: [id number] Expiration: 03/2022 NDC: 0258-5277-82  Bacteriostatic 0.9% Sodium Chloride- 38mL total [id number]:**[id number] Expiration: 03/2021 NDC: 15400-867-61  Dx: G43.709 SP

## 2020-04-16 NOTE — Progress Notes (Signed)
04/16/2020 ALL: She returns for Botox. She is doing well. She has had 2-3 significant migraines over the past 3 months. Tension style headaches wax and wane. She was concerned about a sharp stabbing headache that woke her from sleep last week. Headache got better when she got up and moved around. Dad has sleep apnea and not overweight. She is concerned she may need to be tested. She endorses snoring, dry mouth, daytime sleepiness, frequent waking. She is using Bernita Raisin for abortive therapy that is helpful. I have given her educational materials in AVS and instructed her to call for follow up should she wish to discuss sleep med referral.   01/15/2020 ALL: She continues to do very well from a headache standpoint. She can go a month or so without a migraine. She continues to work closely with psychiatry and internal med. Iron deficiency now corrected. Thyroid is well managed. She admits to more stress at home. She continues schooling.   Consent Form Botulism Toxin Injection For Chronic Migraine    Reviewed orally with patient, additionally signature is on file:  Botulism toxin has been approved by the Federal drug administration for treatment of chronic migraine. Botulism toxin does not cure chronic migraine and it may not be effective in some patients.  The administration of botulism toxin is accomplished by injecting a small amount of toxin into the muscles of the neck and head. Dosage must be titrated for each individual. Any benefits resulting from botulism toxin tend to wear off after 3 months with a repeat injection required if benefit is to be maintained. Injections are usually done every 3-4 months with maximum effect peak achieved by about 2 or 3 weeks. Botulism toxin is expensive and you should be sure of what costs you will incur resulting from the injection.  The side effects of botulism toxin use for chronic migraine may include:   -Transient, and usually mild, facial weakness with  facial injections  -Transient, and usually mild, head or neck weakness with head/neck injections  -Reduction or loss of forehead facial animation due to forehead muscle weakness  -Eyelid drooping  -Dry eye  -Pain at the site of injection or bruising at the site of injection  -Double vision  -Potential unknown long term risks   Contraindications: You should not have Botox if you are pregnant, nursing, allergic to albumin, have an infection, skin condition, or muscle weakness at the site of the injection, or have myasthenia gravis, Lambert-Eaton syndrome, or ALS.  It is also possible that as with any injection, there may be an allergic reaction or no effect from the medication. Reduced effectiveness after repeated injections is sometimes seen and rarely infection at the injection site may occur. All care will be taken to prevent these side effects. If therapy is given over a long time, atrophy and wasting in the muscle injected may occur. Occasionally the patient's become refractory to treatment because they develop antibodies to the toxin. In this event, therapy needs to be modified.  I have read the above information and consent to the administration of botulism toxin.    BOTOX PROCEDURE NOTE FOR MIGRAINE HEADACHE  Contraindications and precautions discussed with patient(above). Aseptic procedure was observed and patient tolerated procedure. Procedure performed by Shawnie Dapper, FNP-C.   The condition has existed for more than 6 months, and pt does not have a diagnosis of ALS, Myasthenia Gravis or Lambert-Eaton Syndrome.  Risks and benefits of injections discussed and pt agrees to proceed with the procedure.  Written consent obtained  These injections are medically necessary. Pt  receives good benefits from these injections. These injections do not cause sedations or hallucinations which the oral therapies may cause.   Description of procedure:  The patient was placed in a sitting position.  The standard protocol was used for Botox as follows, with 5 units of Botox injected at each site:  -Procerus muscle, midline injection  -Corrugator muscle, bilateral injection  -Frontalis muscle, bilateral injection, with 2 sites each side, medial injection was performed in the upper one third of the frontalis muscle, in the region vertical from the medial inferior edge of the superior orbital rim. The lateral injection was again in the upper one third of the forehead vertically above the lateral limbus of the cornea, 1.5 cm lateral to the medial injection site.  -Temporalis muscle injection, 4 sites, bilaterally. The first injection was 3 cm above the tragus of the ear, second injection site was 1.5 cm to 3 cm up from the first injection site in line with the tragus of the ear. The third injection site was 1.5-3 cm forward between the first 2 injection sites. The fourth injection site was 1.5 cm posterior to the second injection site. 5th site laterally in the temporalis  muscleat the level of the outer canthus.  -Occipitalis muscle injection, 3 sites, bilaterally. The first injection was done one half way between the occipital protuberance and the tip of the mastoid process behind the ear. The second injection site was done lateral and superior to the first, 1 fingerbreadth from the first injection. The third injection site was 1 fingerbreadth superiorly and medially from the first injection site.  -Cervical paraspinal muscle injection, 2 sites, bilaterally. The first injection site was 1 cm from the midline of the cervical spine, 3 cm inferior to the lower border of the occipital protuberance. The second injection site was 1.5 cm superiorly and laterally to the first injection site.  -Trapezius muscle injection was performed at 3 sites, bilaterally. The first injection site was in the upper trapezius muscle halfway between the inflection point of the neck, and the acromion. The second injection site  was one half way between the acromion and the first injection site. The third injection was done between the first injection site and the inflection point of the neck.   Will return for repeat injection in 3 months.   A total of 200 units of Botox was prepared, 155 units of Botox was injected as documented above, any Botox not injected was wasted. The patient tolerated the procedure well, there were no complications of the above procedure.

## 2020-04-23 DIAGNOSIS — F4323 Adjustment disorder with mixed anxiety and depressed mood: Secondary | ICD-10-CM | POA: Diagnosis not present

## 2020-05-06 DIAGNOSIS — F4323 Adjustment disorder with mixed anxiety and depressed mood: Secondary | ICD-10-CM | POA: Diagnosis not present

## 2020-05-08 DIAGNOSIS — A049 Bacterial intestinal infection, unspecified: Secondary | ICD-10-CM | POA: Diagnosis not present

## 2020-05-08 DIAGNOSIS — R790 Abnormal level of blood mineral: Secondary | ICD-10-CM | POA: Diagnosis not present

## 2020-05-08 DIAGNOSIS — K900 Celiac disease: Secondary | ICD-10-CM | POA: Diagnosis not present

## 2020-05-08 DIAGNOSIS — R5382 Chronic fatigue, unspecified: Secondary | ICD-10-CM | POA: Diagnosis not present

## 2020-05-13 ENCOUNTER — Encounter: Payer: Self-pay | Admitting: Family Medicine

## 2020-05-14 DIAGNOSIS — F411 Generalized anxiety disorder: Secondary | ICD-10-CM | POA: Diagnosis not present

## 2020-05-14 DIAGNOSIS — F4310 Post-traumatic stress disorder, unspecified: Secondary | ICD-10-CM | POA: Diagnosis not present

## 2020-05-19 ENCOUNTER — Other Ambulatory Visit: Payer: Self-pay | Admitting: Family Medicine

## 2020-05-19 ENCOUNTER — Other Ambulatory Visit: Payer: Self-pay | Admitting: *Deleted

## 2020-05-19 DIAGNOSIS — R519 Headache, unspecified: Secondary | ICD-10-CM

## 2020-05-19 DIAGNOSIS — Z820 Family history of epilepsy and other diseases of the nervous system: Secondary | ICD-10-CM

## 2020-05-19 DIAGNOSIS — R0683 Snoring: Secondary | ICD-10-CM

## 2020-05-19 MED ORDER — BOTOX 100 UNITS IJ SOLR: INTRAMUSCULAR | 0 refills | Status: AC

## 2020-05-19 NOTE — Progress Notes (Signed)
Ref

## 2020-05-21 DIAGNOSIS — F4323 Adjustment disorder with mixed anxiety and depressed mood: Secondary | ICD-10-CM | POA: Diagnosis not present

## 2020-05-28 DIAGNOSIS — H5713 Ocular pain, bilateral: Secondary | ICD-10-CM | POA: Diagnosis not present

## 2020-05-28 DIAGNOSIS — H5310 Unspecified subjective visual disturbances: Secondary | ICD-10-CM | POA: Diagnosis not present

## 2020-06-04 DIAGNOSIS — F4323 Adjustment disorder with mixed anxiety and depressed mood: Secondary | ICD-10-CM | POA: Diagnosis not present

## 2020-06-11 DIAGNOSIS — F4310 Post-traumatic stress disorder, unspecified: Secondary | ICD-10-CM | POA: Diagnosis not present

## 2020-06-11 DIAGNOSIS — F411 Generalized anxiety disorder: Secondary | ICD-10-CM | POA: Diagnosis not present

## 2020-06-15 ENCOUNTER — Telehealth: Payer: Self-pay | Admitting: Family Medicine

## 2020-06-15 DIAGNOSIS — F4323 Adjustment disorder with mixed anxiety and depressed mood: Secondary | ICD-10-CM | POA: Diagnosis not present

## 2020-06-15 NOTE — Telephone Encounter (Signed)
Patient has a Botox appointment 6/21. I called [facility] and spoke with Ruma. Botox TBD 5/18.

## 2020-06-16 DIAGNOSIS — G43109 Migraine with aura, not intractable, without status migrainosus: Secondary | ICD-10-CM | POA: Diagnosis not present

## 2020-06-16 DIAGNOSIS — F419 Anxiety disorder, unspecified: Secondary | ICD-10-CM | POA: Diagnosis not present

## 2020-06-16 DIAGNOSIS — G43709 Chronic migraine without aura, not intractable, without status migrainosus: Secondary | ICD-10-CM | POA: Diagnosis not present

## 2020-06-16 DIAGNOSIS — G44209 Tension-type headache, unspecified, not intractable: Secondary | ICD-10-CM | POA: Diagnosis not present

## 2020-06-16 DIAGNOSIS — F4310 Post-traumatic stress disorder, unspecified: Secondary | ICD-10-CM | POA: Diagnosis not present

## 2020-06-17 NOTE — Telephone Encounter (Signed)
Received (2) 100 unit vials of Botox today from [facility].

## 2020-06-23 ENCOUNTER — Other Ambulatory Visit: Payer: Self-pay

## 2020-06-23 ENCOUNTER — Ambulatory Visit (INDEPENDENT_AMBULATORY_CARE_PROVIDER_SITE_OTHER): Payer: BC Managed Care – PPO | Admitting: Obstetrics & Gynecology

## 2020-06-23 ENCOUNTER — Encounter: Payer: Self-pay | Admitting: Obstetrics & Gynecology

## 2020-06-23 ENCOUNTER — Other Ambulatory Visit (HOSPITAL_COMMUNITY)
Admission: RE | Admit: 2020-06-23 | Discharge: 2020-06-23 | Disposition: A | Discharge status: Home / Self Care | Payer: BC Managed Care – PPO | Source: Ambulatory Visit | Attending: Obstetrics & Gynecology | Admitting: Obstetrics & Gynecology

## 2020-06-23 VITALS — BP 125/80 | HR 95 | Ht 64.0 in | Wt 116.0 lb

## 2020-06-23 DIAGNOSIS — Z01419 Encounter for gynecological examination (general) (routine) without abnormal findings: Secondary | ICD-10-CM

## 2020-06-23 DIAGNOSIS — Z1231 Encounter for screening mammogram for malignant neoplasm of breast: Secondary | ICD-10-CM

## 2020-06-23 NOTE — Patient Instructions (Signed)
Please schedule a mammogram at one of the following locations:  [place]: [phone]  [facility] [place]:[phone] [address]  

## 2020-06-23 NOTE — Progress Notes (Signed)
WELL-WOMAN EXAMINATION Patient name: Tina Haney MRN [id number]  Date of birth: [birth year] Chief Complaint:   Annual Exam  History of Present Illness:   Tina Haney is a 40 y.o. G39P4 female being seen today for a routine well-woman exam.  Today she notes that she is overall doing well.  Denies abnormal discharge, itching or irritation.  Denies pelvic or abdominal pain.  Urinary concerns: +frequency q .  + urgency Denies incontinence Not interested in medication treatment at this time  Patient's last menstrual period was 06/20/2020. Denies issues with her menses The current method of family planning is vasectomy.   Life a bit hectic- husband recently lost his job so back to searching.  4 children, 3 of whom are teenagers.  Recently moved back to [place] ~ 63yr from [place] so still adjusting.  Last pap 08/2017.  Last mammogram: ordered. Last colonoscopy: n/a  Depression screen [facility] 2/9 06/23/2020 09/04/2017 08/10/2017 04/13/2017  Decreased Interest 1 0 2 0  Down, Depressed, Hopeless 1 0 0 0  PHQ - 2 Score 2 0 2 0  Altered sleeping 2 0 0 -  Tired, decreased energy 1 3 3  -  Change in appetite 1 0 1 -  Feeling bad or failure about yourself  0 0 3 -  Trouble concentrating 2 0 0 -  Moving slowly or fidgety/restless 1 0 0 -  Suicidal thoughts 0 0 0 -  PHQ-9 Score 9 3 9  -  Difficult doing work/chores - Very difficult Very difficult -      Review of Systems:   Pertinent items are noted in HPI Denies any headaches, blurred vision, fatigue, shortness of breath, chest pain, abdominal pain, bowel movements, urination, or intercourse unless otherwise stated above.  Pertinent History Reviewed:  Reviewed past medical,surgical, social and family history.  Reviewed problem list, medications and allergies. Physical Assessment:   Vitals:   06/23/20 0839  BP: 125/80  Pulse: 95  Weight: 52.6 kg  Height: 5\' 4"  (1.626 m)  Body mass index is 19.91 kg/m.        Physical Examination:    General appearance - well appearing, and in no distress  Mental status - alert, oriented to person, place, and time  Psych:  She has a normal mood and affect  Skin - warm and dry, normal color, no suspicious lesions noted  Chest - effort normal, all lung fields clear to auscultation bilaterally  Heart - normal rate and regular rhythm  Neck:  midline trachea, no thyromegaly or nodules  Breasts - breasts appear normal, no suspicious masses, no skin or nipple changes or  axillary nodes  Abdomen - soft, nontender, nondistended, no masses or organomegaly  Pelvic - VULVA: normal appearing vulva with no masses, tenderness or lesions  VAGINA: normal appearing vagina with normal color and discharge, no lesions  CERVIX: normal appearing cervix without discharge or lesions, no CMT  Thin prep pap is done with HR HPV cotesting  UTERUS: uterus is felt to be normal size, shape, consistency and nontender   ADNEXA: No adnexal masses or tenderness noted.  Extremities:  No swelling or varicosities noted  Chaperone: pt declined     Assessment & Plan:  1) Well-Woman Exam -pap collected -mammogram ordered  2) OAB -reviewed conservative management, f/u prn may consider medication though she is concerned about side effects  Orders Placed This Encounter  Procedures  . MM 3D SCREEN BREAST BILATERAL    Meds: No orders of the defined types were placed in  this encounter.   Follow-up: Return in about 1 year (around 06/23/2021) for Annual, please print AVS.   Myna Hidalgo, DO Attending Obstetrician & Gynecologist, [facility] for [facility], [facility]

## 2020-06-25 DIAGNOSIS — F4323 Adjustment disorder with mixed anxiety and depressed mood: Secondary | ICD-10-CM | POA: Diagnosis not present

## 2020-06-26 LAB — CYTOLOGY - PAP
Adequacy: ABSENT
Comment: NEGATIVE
Diagnosis: NEGATIVE
Diagnosis: REACTIVE
High risk HPV: NEGATIVE

## 2020-07-14 ENCOUNTER — Encounter: Payer: Self-pay | Admitting: Neurology

## 2020-07-14 ENCOUNTER — Other Ambulatory Visit: Payer: Self-pay

## 2020-07-14 ENCOUNTER — Ambulatory Visit: Payer: BC Managed Care – PPO | Admitting: Neurology

## 2020-07-14 VITALS — BP 104/74 | HR 91 | Ht 64.0 in | Wt 113.0 lb

## 2020-07-14 DIAGNOSIS — R0683 Snoring: Secondary | ICD-10-CM

## 2020-07-14 DIAGNOSIS — R519 Headache, unspecified: Secondary | ICD-10-CM

## 2020-07-14 DIAGNOSIS — G4719 Other hypersomnia: Secondary | ICD-10-CM | POA: Diagnosis not present

## 2020-07-14 DIAGNOSIS — Z820 Family history of epilepsy and other diseases of the nervous system: Secondary | ICD-10-CM | POA: Diagnosis not present

## 2020-07-14 DIAGNOSIS — G478 Other sleep disorders: Secondary | ICD-10-CM

## 2020-07-14 NOTE — Patient Instructions (Signed)
  Based on your symptoms and your exam I believe you may be at risk for obstructive sleep apnea (aka OSA), and I think we should proceed with a sleep study to determine whether you do or do not have OSA and how severe it is. Even, if you have mild OSA, I may want you to consider treatment with CPAP, as treatment of even borderline or mild sleep apnea can result and improvement of symptoms such as sleep disruption, daytime sleepiness, nighttime bathroom breaks, restless leg symptoms, improvement of headache syndromes, even improved mood disorder.   As explained, an attended sleep study meaning you get to stay overnight in the sleep lab, lets [place] monitor sleep-related behaviors such as sleep talking and leg movements in sleep, in addition to monitoring for sleep apnea.  A home sleep test is a screening tool for sleep apnea only, and unfortunately does not help with any other sleep-related diagnoses.  Please remember, the long-term risks and ramifications of untreated moderate to severe obstructive sleep apnea are: increased Cardiovascular disease, including congestive heart failure, stroke, difficult to control hypertension, treatment resistant obesity, arrhythmias, especially irregular heartbeat commonly known as A. Fib. (atrial fibrillation); even type 2 diabetes has been linked to untreated OSA.   Sleep apnea can cause disruption of sleep and sleep deprivation in most cases, which, in turn, can cause recurrent headaches, problems with memory, mood, concentration, focus, and vigilance. Most people with untreated sleep apnea report excessive daytime sleepiness, which can affect their ability to drive. Please do not drive if you feel sleepy. Patients with sleep apnea can also develop difficulty initiating and maintaining sleep (aka insomnia).   Having sleep apnea may increase your risk for other sleep disorders, including involuntary behaviors sleep such as sleep terrors, sleep talking, sleepwalking.     Having sleep apnea can also increase your risk for restless leg syndrome and leg movements at night.   Please note that untreated obstructive sleep apnea may carry additional perioperative morbidity. Patients with significant obstructive sleep apnea (typically, in the moderate to severe degree) should receive, if possible, perioperative PAP (positive airway pressure) therapy and the surgeons and particularly the anesthesiologists should be informed of the diagnosis and the severity of the sleep disordered breathing.   I will likely see you back after your sleep study to go over the test results and where to go from there. We will call you after your sleep study to advise about the results (most likely, you will hear from [facility], my nurse) and to set up an appointment at the time, as necessary.    Our sleep lab administrative assistant will call you to schedule your sleep study and give you further instructions, regarding the check in process for the sleep study, arrival time, what to bring, when you can expect to leave after the study, etc., and to answer any other logistical questions you may have. If you don't hear back from her by about 2 weeks from now, please feel free to call her direct line at [phone] or you can call our general clinic number, or email [place] through My Chart.

## 2020-07-14 NOTE — Progress Notes (Signed)
Subjective:    Patient ID: Tina Haney is a 40 y.o. female.  HPI    Huston Foley, MD, PhD [facility] [address] [address] [place], [place] [address]  Dear Linton Rump,   I saw your patient, Tina Haney, upon your kind request in my sleep clinic today for initial consultation of her sleep disorder, in particular, concern for underlying obstructive sleep apnea.  The patient is unaccompanied today.  As you know, Tina Haney is a 40 year old right-handed woman with an underlying medical history of migraine headaches, treated with Botox injections, thyroid nodule, history of EBV infection, anemia, arthritis, reflux disease, and cervical disc disease, who reports snoring and excessive daytime somnolence as well as morning headaches. Her father was recently diagnosed with sleep apnea and has a CPAP or similar machine.  She does not wake up rested, she has chronic sleep difficulty for years.  She was diagnosed with ADHD when she was in college but symptoms go back to childhood she reports.  She is on Concerta currently.  She takes gabapentin at night for sleep.  She sees an integrative medicine specialist.  She reports increase in stress, particular marital stress and is planning a separation as I understand.  She currently lives with her family, which includes husband and 4 children, ages 39, 56, 7 and 58.  They have 1 dog in the household, the dog does not sleep in the bedroom with her, she does not have a TV in the bedroom.  She has tried essential oils to help her sleep and relax at night, she uses a I an eye sleep mask to block out light.  I reviewed your office note from 04/16/2020.  She received Botox injections at the time for her migraines.  Her Epworth sleepiness score is 12 out of 24, fatigue severity score is 55 out of 63.  Bedtime is generally around 10 PM and rise time around 6:45 AM.  She is currently unemployed.  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  She is hoping to move [place] TO the [place] area.  She does not smoke or drink alcohol, she drinks caffeine in the form of coffee, 1 cup/day on average.  She used to have restless leg symptoms but not currently and has occasionally woken up with a headache.  She does not have night to night nocturia.  Her Past Medical History Is Significant For: Past Medical History:  Diagnosis Date   Acute Epstein Barr virus (EBV) infection    Anemia    Arthritis    Chronic fatigue    Chronic posttraumatic stress syndrome    Concussion    09/2015,  Was 2nd or 3rd concussion.    Esophagitis    GERD (gastroesophageal reflux disease)    Headache    Herniated disc, cervical    C5/6 and 6/7    Migraines    Murmur, heart    as a child    Paresthesia    Polyp of colon    PTSD (post-traumatic stress disorder)    Thyroid nodule     Her Past Surgical History Is Significant For: Past Surgical History:  Procedure Laterality Date   ANTERIOR CERVICAL DECOMP/DISCECTOMY FUSION N/A 11/30/2017   Procedure: ANTERIOR CERVICAL DECOMPRESSION/DISCECTOMY FUSION CERVICAL 5-7;  Surgeon: Venita Lick, MD;  Location: [facility] OR;  Service: Orthopedics;  Laterality: N/A;  3.5 hrs   COLONOSCOPY     Done in [place], 2 polyps, benign   DENTAL SURGERY  UPPER GI ENDOSCOPY     recent EGD done in [place] by Dr. Uvaldo Rising    Her Family History Is Significant For: Family History  Problem Relation Age of Onset   Bipolar disorder Mother    ADD / ADHD Mother    Celiac disease Daughter    Cancer Maternal Grandmother        breast   Alzheimer's disease Maternal Grandmother    Kidney disease Maternal Grandfather    Migraines Father    Dementia Father    Skin cancer Father    Eczema Son    ADD / ADHD Son    ADD / ADHD Daughter     Her Social History Is Significant For: Social History   Socioeconomic History   Marital status: Married    Spouse name: Bernette Redbird   Number of children: 4   Years of education: Not on  file   Highest education level: Not on file  Occupational History    Comment: na  Tobacco Use   Smoking status: Never   Smokeless tobacco: Never  Vaping Use   Vaping Use: Never used  Substance and Sexual Activity   Alcohol use: No   Drug use: No   Sexual activity: Yes    Partners: Male    Birth control/protection: Surgical    Comment: husband vastectomy  Other Topics Concern   Not on file  Social History Narrative   Born in [place]. Grew up in [place] and [place]. Married with four children. Married for 15 years-12, 11, 10, 7. Attend school at [employer].   Education: BS some masters work.  Work: just had to resign.  Caffiene: one cup of tea / day.    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   [place], [place].    Came back to the [place] in 07/2016.    Seen by Functional medicine doctor and had lots of labs done and put on lots of supplement.    Social Determinants of Health   Financial Resource Strain: Not on file  Food Insecurity: Not on file  Transportation Needs: Not on file  Physical Activity: Not on file  Stress: Not on file  Social Connections: Not on file    His Allergies Are:  Allergies  Allergen Reactions   Gluten Meal Other (See Comments)    Joint problems   Aleve [Naproxen Sodium] Rash   Percocet [Oxycodone-Acetaminophen] Rash    Itching, throat closing  :   Her Current Medications Are:  Outpatient Encounter Medications as of 07/14/2020  Medication Sig   Artificial Tear Ointment (DRY EYES OP) Place 1 drop into both eyes 3 (three) times daily as needed (for dry eyes).   botulinum toxin Type A (BOTOX) 100 units SOLR injection Inject 155 units IM into head and neck every 3 months by the provider.   CALCIUM PO Take 1 tablet by mouth daily.    desvenlafaxine (PRISTIQ) 50 MG 24 hr tablet 1 tablet   DHEA 10 MG TABS Take by mouth.   Ferrous Gluconate-C-Folic Acid (IRON-C PO) Take by mouth.   gabapentin  (NEURONTIN) 100 MG capsule Take 100 mg by mouth 3 (three) times daily.   gabapentin (NEURONTIN) 600 MG tablet Take 600 mg by mouth at bedtime.   MAGNESIUM CITRATE PO Take by mouth.   methocarbamol (ROBAXIN) 500 MG tablet Take 500 mg by mouth every 6 (six) hours as needed for muscle spasms.   methylphenidate 54 MG PO  CR tablet Take 54 mg by mouth every morning.   Multiple Vitamins-Minerals (MULTIVITAMIN PO) Take 1 capsule by mouth 3 (three) times daily.   Omega-3 Fatty Acids (FISH OIL PO) Take 1 capsule by mouth 3 (three) times daily.   thyroid (ARMOUR) 30 MG tablet Take 30 mg by mouth daily before breakfast.   Ubrogepant (UBRELVY) 100 MG TABS Take 100 mg by mouth daily as needed. Take one tablet at onset of headache, may repeat 1 tablet in 2 hours, no more than 2 tablets in 24 hours   UNABLE TO FIND Med Name: Optiferin-C 100 mg vit C, 11 mg calcium, Iron 28 mg   valACYclovir (VALTREX) 1000 MG tablet Take 1,000 mg by mouth daily.   VITAMIN D PO Take 1 tablet by mouth daily.   No facility-administered encounter medications on file as of 07/14/2020.  :   Review of Systems:  Out of a complete 14 point review of systems, all are reviewed and negative with the exception of these symptoms as listed below:  Review of Systems  Neurological:        Here for sleep consult. No prior sleep study. Snoring is present, and does not feel well rested after she sleeps at night. Hx of headaches/migraines.  Epworth Sleepiness Scale 0= would never doze 1= slight chance of dozing 2= moderate chance of dozing 3= high chance of dozing  Sitting and reading:1 Watching TV:1 Sitting inactive in a public place (ex. Theater or meeting):2 As a passenger in a car for an hour without a break:2 Lying down to rest in the afternoon:3 Sitting and talking to someone:0 Sitting quietly after lunch (no alcohol):2 In a car, while stopped in traffic:1 Total:12    Objective:  Neurological Exam  Physical Exam Physical  Examination:   Vitals:   07/14/20 0918  BP: 104/74  Pulse: 91    General Examination: The patient is a very pleasant 40 y.o. female in no acute distress. She appears well-developed and well-nourished and well groomed.   HEENT: Normocephalic, atraumatic, pupils are equal, round and reactive to light, extraocular tracking is good without limitation to gaze excursion or nystagmus noted. Hearing is grossly intact. Face is symmetric with normal facial animation. Speech is clear with no dysarthria noted. There is no hypophonia. There is no lip, neck/head, jaw or voice tremor. Neck is supple with full range of passive and active motion. There are no carotid bruits on auscultation. Oropharynx exam reveals: moderate mouth dryness, adequate dental hygiene and mild airway crowding, due to small airway entry, tonsillar size of 1+, slightly wider tongue.  Mallampati class II.  Neck circumference is 12-1/2 inches.  She has a minimal overbite.  Tongue protrudes centrally and palate elevates symmetrically.  Chest: Clear to auscultation without wheezing, rhonchi or crackles noted.  Heart: S1+S2+0, regular and normal without murmurs, rubs or gallops noted.   Abdomen: Soft, non-tender and non-distended.  Extremities: There is no obvious edema in the distal lower extremities bilaterally.   Skin: Warm and dry without trophic changes noted.   Musculoskeletal: exam reveals no obvious joint deformities, tenderness or joint swelling or erythema.   Neurologically:  Mental status: The patient is awake, alert and oriented in all 4 spheres. Her immediate and remote memory, attention, language skills and fund of knowledge are appropriate. There is no evidence of aphasia, agnosia, apraxia or anomia. Speech is clear with normal prosody and enunciation. Thought process is linear. Mood is normal and affect is normal.  Cranial nerves II -  XII are as described above under HEENT exam.  Motor exam: Normal bulk, strength and  tone is noted. There is no tremor, Romberg is negative. Fine motor skills and coordination: grossly intact.  Cerebellar testing: No dysmetria or intention tremor. There is no truncal or gait ataxia.  Sensory exam: intact to light touch in the upper and lower extremities.  Gait, station and balance: She stands easily. No veering to one side is noted. No leaning to one side is noted. Posture is age-appropriate and stance is narrow based. Gait shows normal stride length and normal pace. No problems turning are noted. Tandem walk is unremarkable.                Assessment and Plan:  In summary, Tina Haney is a very pleasant 39 y.o.-year old female ith an underlying medical history of migraine headaches, treated with Botox injections, thyroid nodule, history of EBV infection, anemia, arthritis, reflux disease, and cervical disc disease, whose history and physical exam are concerning for obstructive sleep apnea (OSA). I had a long chat with the patient about my findings and the diagnosis of OSA, its prognosis and treatment options. We talked about medical treatments, surgical interventions and non-pharmacological approaches. I explained in particular the risks and ramifications of untreated moderate to severe OSA, especially with respect to developing cardiovascular disease down the Road, including congestive heart failure, difficult to treat hypertension, cardiac arrhythmias, or stroke. Even type 2 diabetes has, in part, been linked to untreated OSA. Symptoms of untreated OSA include daytime sleepiness, memory problems, mood irritability and mood disorder such as depression and anxiety, lack of energy, as well as recurrent headaches, especially morning headaches. We talked about trying to maintain a healthy lifestyle in general. We also talked about the importance of good sleep hygiene. I recommended the following at this time: sleep study.   I explained the sleep test procedure to the patient and also  outlined possible surgical and non-surgical treatment options of OSA, including the use of a custom-made dental device (which would require a referral to a specialist dentist or oral surgeon), surgical options (which would involve a referral to an ENT surgeon). I also explained the PAP (positive airway pressure) treatment option to the patient, who indicated that she would be willing to try PAP if the need arises.  We will pick up our discussion after testing.  We will keep her posted as to her test results by phone call as well.  I answered all her questions today and she was in agreement.  Thank you very much for allowing me to participate in the care of this nice patient. If I can be of any further assistance to you please do not hesitate to talk to me. Sincerely,   Huston Foley, MD, PhD

## 2020-07-20 ENCOUNTER — Telehealth: Payer: Self-pay

## 2020-07-20 NOTE — Telephone Encounter (Signed)
LVM for pt to call me back to schedule sleep study  

## 2020-07-21 ENCOUNTER — Ambulatory Visit: Payer: BC Managed Care – PPO | Admitting: Family Medicine

## 2020-07-21 ENCOUNTER — Encounter: Payer: Self-pay | Admitting: Family Medicine

## 2020-07-21 VITALS — Wt 112.0 lb

## 2020-07-21 DIAGNOSIS — G43709 Chronic migraine without aura, not intractable, without status migrainosus: Secondary | ICD-10-CM | POA: Diagnosis not present

## 2020-07-21 NOTE — Progress Notes (Signed)
Botox- 100 units x 2 vials Lot: [id number] Expiration: 06/2022 NDC: 8757-9728-20  Bacteriostatic 0.9% Sodium Chloride- 55mL total Lot: [id number] Expiration: 07/01/2021 NDC: 5379-4327-61  Dx: G43.7.9 S/P
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
## 2020-07-23 ENCOUNTER — Ambulatory Visit (INDEPENDENT_AMBULATORY_CARE_PROVIDER_SITE_OTHER): Payer: BC Managed Care – PPO | Admitting: Neurology

## 2020-07-23 ENCOUNTER — Telehealth: Payer: Self-pay

## 2020-07-23 DIAGNOSIS — F411 Generalized anxiety disorder: Secondary | ICD-10-CM | POA: Diagnosis not present

## 2020-07-23 DIAGNOSIS — F4310 Post-traumatic stress disorder, unspecified: Secondary | ICD-10-CM | POA: Diagnosis not present

## 2020-07-23 DIAGNOSIS — G4719 Other hypersomnia: Secondary | ICD-10-CM

## 2020-07-23 DIAGNOSIS — G4710 Hypersomnia, unspecified: Secondary | ICD-10-CM

## 2020-07-23 DIAGNOSIS — R0683 Snoring: Secondary | ICD-10-CM

## 2020-07-23 DIAGNOSIS — G478 Other sleep disorders: Secondary | ICD-10-CM

## 2020-07-23 DIAGNOSIS — R519 Headache, unspecified: Secondary | ICD-10-CM

## 2020-07-23 DIAGNOSIS — Z820 Family history of epilepsy and other diseases of the nervous system: Secondary | ICD-10-CM

## 2020-07-23 NOTE — Telephone Encounter (Signed)
LVM for pt to call me back to schedule sleep study  

## 2020-07-28 NOTE — Progress Notes (Signed)
See procedure note.

## 2020-07-30 NOTE — Procedures (Signed)
   [facility]  HOME SLEEP TEST (Watch PAT) REPORT  STUDY DATE: 07-26-20  DOB: [birth year]  MRN: [id number]  ORDERING CLINICIAN: Huston Foley, MD, PhD   REFERRING CLINICIAN: Shawnie Dapper, NP  CLINICAL INFORMATION/HISTORY: 40 year old woman with a history of migraine headaches, treated with Botox injections, thyroid nodule, history of EBV infection, anemia, arthritis, reflux disease, and cervical disc disease, who reports snoring and excessive daytime somnolence as well as morning headaches.   Epworth sleepiness score: 12/24.  BMI: 19.2 kg/m  Neck Circumference: 12.5"  FINDINGS:   Sleep Summary:   Total Recording Time (hours, min): 7 h 56 min  Total Sleep Time (hours, min):  6 h 45 min   Percent REM (%):    22.42%   Respiratory Indices:   Calculated pAHI (per hour):  1.6/hour         REM pAHI:       4.0/hour       NREM pAHI:   1.0/hour  Supine AHI:    2.0/hour  Oxygen Saturation Statistics:    Oxygen Saturation (%) Mean: 96%   Minimum oxygen saturation (%):       93%   O2 Saturation Range (%):   93 - 99%    O2 Saturation (minutes) <=88%: 0 min  Pulse Rate Statistics:   Pulse Mean (bpm):   67/min    Pulse Range   39 - 97/min   IMPRESSION: Primary snoring   RECOMMENDATION:  This home sleep test does not demonstrate any significant obstructive or central sleep disordered breathing. Some snoring was noted and appeared to be intermittent, in the mild to moderate range. Other causes of the patient's symptoms, including circadian rhythm disturbances, an underlying mood disorder, medication effect and/or an underlying medical problem cannot be ruled out based on this test. Clinical correlation is recommended. The patient should be cautioned not to drive, work at heights, or operate dangerous or heavy equipment when tired or sleepy. Review and reiteration of good sleep hygiene measures should be pursued with any patient. The patient can follow up with her  referring provider, who will be notified of the test results. An appointment in sleep clinic can be made as necessary.   I certify that I have reviewed the raw data recording prior to the issuance of this report in accordance with the standards of the American Academy of Sleep Medicine (AASM).  INTERPRETING PHYSICIAN:   Huston Foley, MD, PhD  Board Certified in Neurology and Sleep Medicine  [facility] [address] [place], [place] [address] [phone]

## 2020-08-01 ENCOUNTER — Encounter: Payer: Self-pay | Admitting: Neurology

## 2020-08-24 DIAGNOSIS — N3000 Acute cystitis without hematuria: Secondary | ICD-10-CM | POA: Diagnosis not present

## 2020-08-24 DIAGNOSIS — Z681 Body mass index (BMI) 19 or less, adult: Secondary | ICD-10-CM | POA: Diagnosis not present

## 2020-08-24 DIAGNOSIS — T7491XA Unspecified adult maltreatment, confirmed, initial encounter: Secondary | ICD-10-CM | POA: Diagnosis not present

## 2020-08-24 DIAGNOSIS — R82998 Other abnormal findings in urine: Secondary | ICD-10-CM | POA: Diagnosis not present

## 2020-08-24 DIAGNOSIS — R300 Dysuria: Secondary | ICD-10-CM | POA: Diagnosis not present

## 2020-08-31 DIAGNOSIS — F4323 Adjustment disorder with mixed anxiety and depressed mood: Secondary | ICD-10-CM | POA: Diagnosis not present

## 2020-09-03 DIAGNOSIS — Z6820 Body mass index (BMI) 20.0-20.9, adult: Secondary | ICD-10-CM | POA: Diagnosis not present

## 2020-09-03 DIAGNOSIS — F4310 Post-traumatic stress disorder, unspecified: Secondary | ICD-10-CM | POA: Diagnosis not present

## 2020-09-03 DIAGNOSIS — F411 Generalized anxiety disorder: Secondary | ICD-10-CM | POA: Diagnosis not present

## 2020-09-03 DIAGNOSIS — M79672 Pain in left foot: Secondary | ICD-10-CM | POA: Diagnosis not present

## 2020-09-03 DIAGNOSIS — M67472 Ganglion, left ankle and foot: Secondary | ICD-10-CM | POA: Diagnosis not present

## 2020-09-10 DIAGNOSIS — F4323 Adjustment disorder with mixed anxiety and depressed mood: Secondary | ICD-10-CM | POA: Diagnosis not present

## 2020-09-14 ENCOUNTER — Telehealth: Payer: Self-pay | Admitting: Family Medicine

## 2020-09-14 DIAGNOSIS — M79672 Pain in left foot: Secondary | ICD-10-CM | POA: Diagnosis not present

## 2020-09-14 DIAGNOSIS — Z6822 Body mass index (BMI) 22.0-22.9, adult: Secondary | ICD-10-CM | POA: Diagnosis not present

## 2020-09-14 DIAGNOSIS — M67479 Ganglion, unspecified ankle and foot: Secondary | ICD-10-CM | POA: Diagnosis not present

## 2020-09-14 NOTE — Telephone Encounter (Signed)
Patient's next Botox appointment is 9/26. Patient's PA for Botox with [employer] expired 08/12/20. I filled out PA form requesting [id number] & [phone] for G43.709. Gave to NP to sign. Will fax to [facility] with notes once signature is received.

## 2020-09-16 NOTE — Telephone Encounter (Signed)
Received approval from [facility]. PA reference #[id number] (09/14/20- 09/13/21). Patient will continue to use [facility] specialty pharmacy.

## 2020-09-17 DIAGNOSIS — F4323 Adjustment disorder with mixed anxiety and depressed mood: Secondary | ICD-10-CM | POA: Diagnosis not present

## 2020-09-23 DIAGNOSIS — F4323 Adjustment disorder with mixed anxiety and depressed mood: Secondary | ICD-10-CM | POA: Diagnosis not present

## 2020-09-26 IMAGING — RF DG C-ARM 61-120 MIN
1 series · 3 of 3 positions shown · non-contrast
Comparison: Brain MRI 04/24/2017.

CLINICAL DATA: 36-year-old female undergoing cervical spine
surgery.

EXAM:
CERVICAL SPINE - 2-3 VIEW; DG C-ARM 61-120 MIN

[Series 1: run · 3 of 3 slices shown]
[im 1/3]
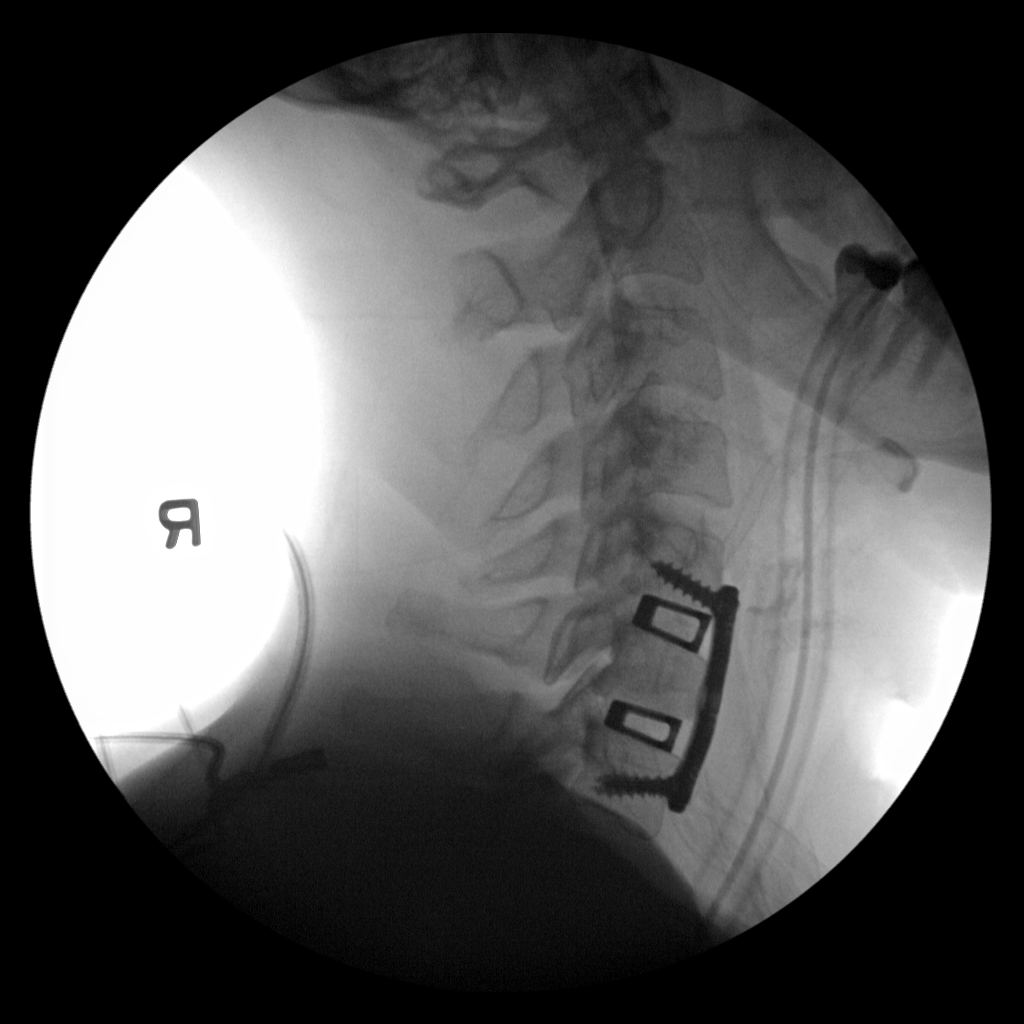
[im 2/3]
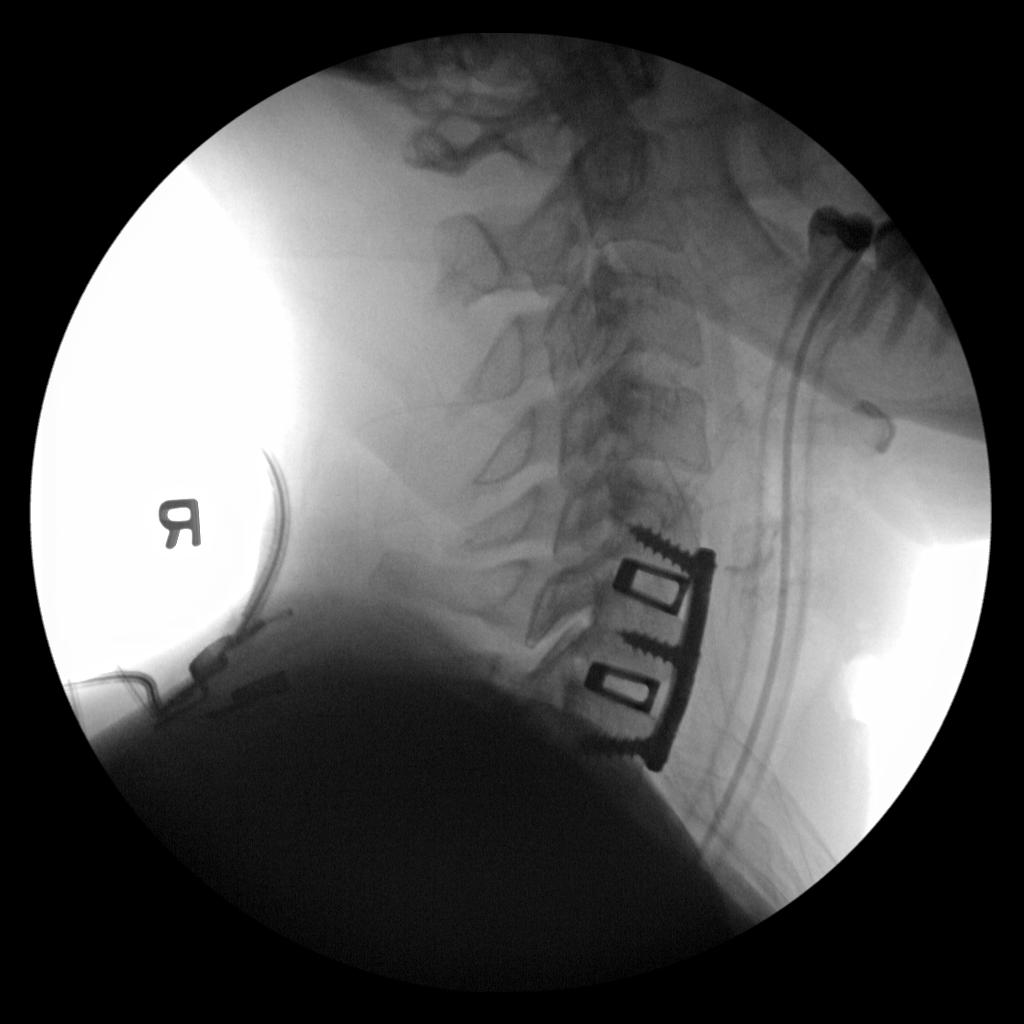
[im 3/3]
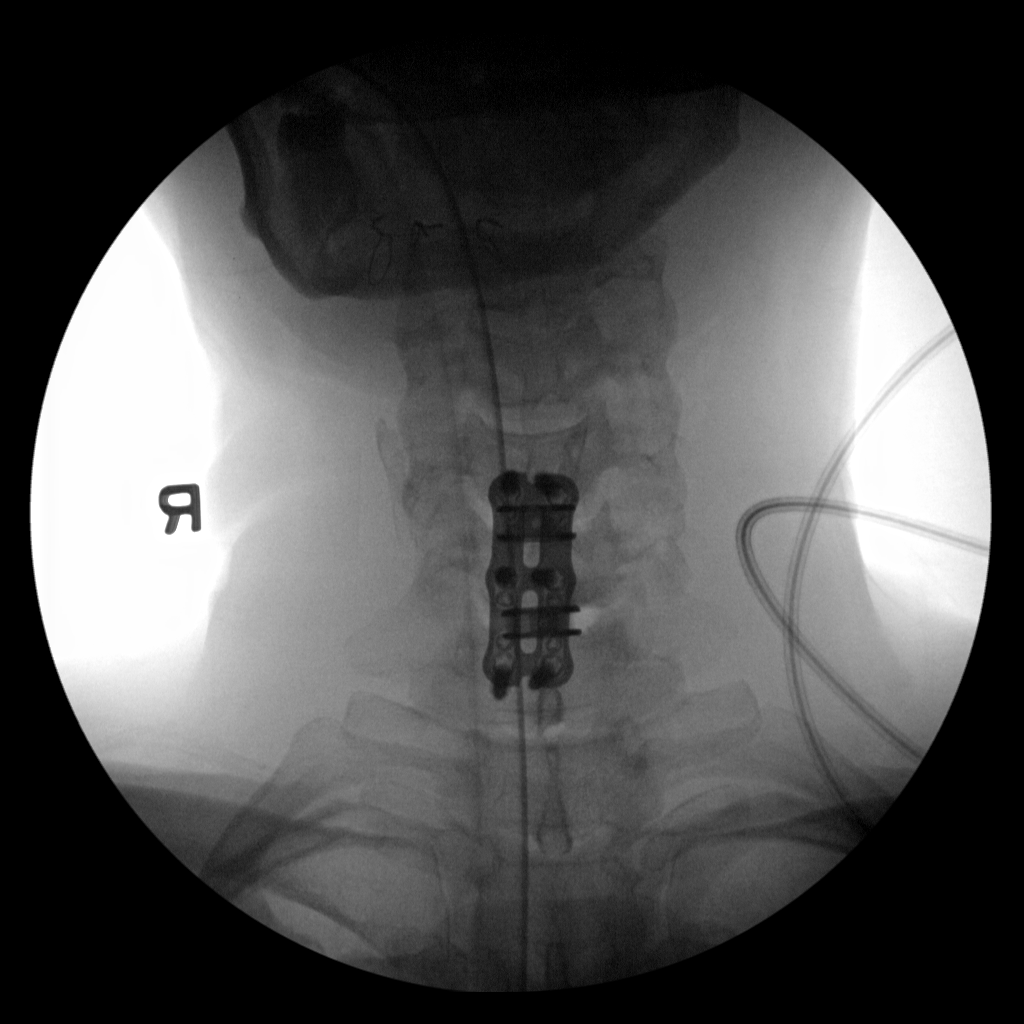

[3 of 3 positions shown; findings below may reference images not displayed]

FINDINGS: Three intraoperative fluoroscopic spot views of the cervical spine
in the AP and lateral projection demonstrate C5 through C7 anterior
fusion hardware with anterior plate, cortical screws, and interbody
implants at C5-C6 and C6-C7. no adverse hardware features.

FLUOROSCOPY TIME:  0 minutes 35 seconds
IMPRESSION: C5-C6 and C6-C7 ACDF.

## 2020-09-30 DIAGNOSIS — F4323 Adjustment disorder with mixed anxiety and depressed mood: Secondary | ICD-10-CM | POA: Diagnosis not present

## 2020-10-12 NOTE — Telephone Encounter (Signed)
I called [facility] @ [phone] to check status of Botox order. I was told that patient has no refills, I provided a verbal prescription.

## 2020-10-12 NOTE — Telephone Encounter (Signed)
Upon checking, patient's insurance is no longer active. I called patient, she states that she still has [employer] [place] but has a new subscriber ID. I updated her chart so it reflects patient's new insurance. It appears to be out of network and non participating. I will check with billing dept before obtaining auth.

## 2020-10-22 NOTE — Telephone Encounter (Signed)
Called patient and LVM as she has not yet seen my MyChart message. Explained that per billing department, her insurance will likely not cover Botox since we are out of network/non participating. Advised patient that I have spoken with Amy, she is agreeable to doing injection on 9/26 and billing for an office visit/use Botox sample. Amy will place a referral after injection to a neurologist in [facility] network. Requested patient call back if she has questions.

## 2020-10-26 ENCOUNTER — Ambulatory Visit: Payer: BLUE CROSS/BLUE SHIELD | Admitting: Family Medicine

## 2020-10-26 DIAGNOSIS — G43709 Chronic migraine without aura, not intractable, without status migrainosus: Secondary | ICD-10-CM
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
## 2020-10-26 NOTE — Progress Notes (Signed)
Botox- 200 units x 1 vial Lot: [id number] Expiration: 10/24 NDC: 2876-8115-72  0.9% Sodium Chloride- 60mL total Lot: [id number] Expiration: 01/31/22 NDC: 9741-6384-53 Dx: M46.803 SAMPLE

## 2020-10-27 ENCOUNTER — Telehealth: Payer: Self-pay | Admitting: Family Medicine

## 2020-10-27 NOTE — Telephone Encounter (Signed)
Referral sent to [facility]. They will reach out to patient to schedule. Phone: [phone].

## 2020-10-28 ENCOUNTER — Other Ambulatory Visit: Payer: Self-pay | Admitting: *Deleted

## 2020-10-28 MED ORDER — UBRELVY 100 MG PO TABS
100.0000 mg | ORAL_TABLET | Freq: Every day | ORAL | 5 refills | Status: AC | PRN
Start: 2020-10-28 — End: ?

## 2021-01-06 ENCOUNTER — Telehealth: Payer: Self-pay | Admitting: Family Medicine

## 2021-01-06 NOTE — Telephone Encounter (Signed)
Pt called, need a referral sent [facility]. But would not be able to get appt until January. Due for Botox in next 2 weeks. Would like a call from the nurse.

## 2021-01-06 NOTE — Telephone Encounter (Signed)
Ladona Ridgel, can you f/u w/ pt? Looks like you previously sent this referral? Will she be able to get botox here w/ current insurance or out of network?

## 2021-01-21 ENCOUNTER — Telehealth: Payer: Self-pay

## 2021-01-21 NOTE — Telephone Encounter (Signed)
PA was started and Approved on November 19 Effective from 12/17/2020 through 12/16/2021.   Key: [id number] - Rx #: [id number]
